# Patient Record
Sex: Male | Born: 1950 | Race: Black or African American | Hispanic: No | Marital: Married | Smoking: Current every day smoker
Health system: Southern US, Community
[De-identification: ages and names within clinical notes are randomized; demographics above are authoritative.]

## PROBLEM LIST (undated history)

## (undated) DIAGNOSIS — E785 Hyperlipidemia, unspecified: Secondary | ICD-10-CM

## (undated) HISTORY — PX: CHEST TUBE INSERTION: SHX231

## (undated) HISTORY — DX: Hyperlipidemia, unspecified: E78.5

---

## 1989-10-29 HISTORY — PX: HAND SURGERY: SHX662

## 2001-12-24 ENCOUNTER — Ambulatory Visit (HOSPITAL_COMMUNITY): Admission: RE | Admit: 2001-12-24 | Discharge: 2001-12-24 | Payer: Self-pay | Admitting: Family Medicine

## 2001-12-24 ENCOUNTER — Encounter: Payer: Self-pay | Admitting: Family Medicine

## 2020-10-29 ENCOUNTER — Emergency Department (HOSPITAL_COMMUNITY): Payer: 59

## 2020-10-29 ENCOUNTER — Other Ambulatory Visit: Payer: Self-pay

## 2020-10-29 ENCOUNTER — Encounter (HOSPITAL_COMMUNITY): Payer: Self-pay | Admitting: *Deleted

## 2020-10-29 ENCOUNTER — Inpatient Hospital Stay (HOSPITAL_COMMUNITY)
Admission: EM | Admit: 2020-10-29 | Discharge: 2020-11-06 | DRG: 205 | Disposition: A | Discharge status: Home Health | Payer: 59 | Attending: General Surgery | Admitting: General Surgery

## 2020-10-29 DIAGNOSIS — S2241XA Multiple fractures of ribs, right side, initial encounter for closed fracture: Secondary | ICD-10-CM | POA: Diagnosis present

## 2020-10-29 DIAGNOSIS — J9811 Atelectasis: Secondary | ICD-10-CM | POA: Diagnosis present

## 2020-10-29 DIAGNOSIS — J9601 Acute respiratory failure with hypoxia: Secondary | ICD-10-CM | POA: Diagnosis not present

## 2020-10-29 DIAGNOSIS — S2239XA Fracture of one rib, unspecified side, initial encounter for closed fracture: Secondary | ICD-10-CM | POA: Diagnosis not present

## 2020-10-29 DIAGNOSIS — I10 Essential (primary) hypertension: Secondary | ICD-10-CM | POA: Diagnosis present

## 2020-10-29 DIAGNOSIS — E876 Hypokalemia: Secondary | ICD-10-CM | POA: Diagnosis present

## 2020-10-29 DIAGNOSIS — F17210 Nicotine dependence, cigarettes, uncomplicated: Secondary | ICD-10-CM | POA: Diagnosis present

## 2020-10-29 DIAGNOSIS — Z9689 Presence of other specified functional implants: Secondary | ICD-10-CM

## 2020-10-29 DIAGNOSIS — E871 Hypo-osmolality and hyponatremia: Secondary | ICD-10-CM | POA: Diagnosis present

## 2020-10-29 DIAGNOSIS — S27321A Contusion of lung, unilateral, initial encounter: Secondary | ICD-10-CM | POA: Diagnosis not present

## 2020-10-29 DIAGNOSIS — S2249XA Multiple fractures of ribs, unspecified side, initial encounter for closed fracture: Secondary | ICD-10-CM

## 2020-10-29 DIAGNOSIS — R339 Retention of urine, unspecified: Secondary | ICD-10-CM | POA: Diagnosis not present

## 2020-10-29 DIAGNOSIS — S270XXA Traumatic pneumothorax, initial encounter: Secondary | ICD-10-CM | POA: Diagnosis present

## 2020-10-29 DIAGNOSIS — J439 Emphysema, unspecified: Secondary | ICD-10-CM

## 2020-10-29 DIAGNOSIS — Z20822 Contact with and (suspected) exposure to covid-19: Secondary | ICD-10-CM | POA: Diagnosis present

## 2020-10-29 DIAGNOSIS — R0600 Dyspnea, unspecified: Secondary | ICD-10-CM

## 2020-10-29 DIAGNOSIS — J432 Centrilobular emphysema: Secondary | ICD-10-CM | POA: Diagnosis present

## 2020-10-29 DIAGNOSIS — J939 Pneumothorax, unspecified: Secondary | ICD-10-CM

## 2020-10-29 LAB — CBC
HCT: 47.3 % (ref 39.0–52.0)
Hemoglobin: 15.3 g/dL (ref 13.0–17.0)
MCH: 28.8 pg (ref 26.0–34.0)
MCHC: 32.3 g/dL (ref 30.0–36.0)
MCV: 88.9 fL (ref 80.0–100.0)
Platelets: 259 10*3/uL (ref 150–400)
RBC: 5.32 MIL/uL (ref 4.22–5.81)
RDW: 13.8 % (ref 11.5–15.5)
WBC: 15.7 10*3/uL — ABNORMAL HIGH (ref 4.0–10.5)
nRBC: 0 % (ref 0.0–0.2)

## 2020-10-29 MED ORDER — IOHEXOL 300 MG/ML  SOLN
100.0000 mL | Freq: Once | INTRAMUSCULAR | Status: AC | PRN
  Administered 2020-10-30: 100 mL via INTRAVENOUS

## 2020-10-29 MED ORDER — FENTANYL CITRATE (PF) 100 MCG/2ML IJ SOLN
75.0000 ug | Freq: Once | INTRAMUSCULAR | Status: AC
  Administered 2020-10-29: 75 ug via INTRAVENOUS
  Filled 2020-10-29: qty 2

## 2020-10-29 MED ORDER — HYDROMORPHONE HCL 1 MG/ML IJ SOLN
0.5000 mg | Freq: Once | INTRAMUSCULAR | Status: AC
  Administered 2020-10-30: 0.5 mg via INTRAVENOUS
  Filled 2020-10-29: qty 1

## 2020-10-29 MED ORDER — ONDANSETRON 8 MG PO TBDP
8.0000 mg | ORAL_TABLET | Freq: Once | ORAL | Status: AC
  Administered 2020-10-29: 8 mg via ORAL
  Filled 2020-10-29: qty 1

## 2020-10-29 MED ORDER — HYDROCODONE-ACETAMINOPHEN 5-325 MG PO TABS
1.0000 | ORAL_TABLET | Freq: Once | ORAL | Status: AC
  Administered 2020-10-29: 1 via ORAL
  Filled 2020-10-29: qty 1
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
## 2020-10-29 NOTE — ED Triage Notes (Signed)
Denies hitting his head.  
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
## 2020-10-30 DIAGNOSIS — S27321A Contusion of lung, unilateral, initial encounter: Secondary | ICD-10-CM | POA: Diagnosis present

## 2020-10-30 DIAGNOSIS — F17210 Nicotine dependence, cigarettes, uncomplicated: Secondary | ICD-10-CM | POA: Diagnosis not present

## 2020-10-30 DIAGNOSIS — S2239XA Fracture of one rib, unspecified side, initial encounter for closed fracture: Secondary | ICD-10-CM | POA: Diagnosis present

## 2020-10-30 DIAGNOSIS — I10 Essential (primary) hypertension: Secondary | ICD-10-CM | POA: Diagnosis not present

## 2020-10-30 DIAGNOSIS — J9601 Acute respiratory failure with hypoxia: Secondary | ICD-10-CM | POA: Diagnosis not present

## 2020-10-30 DIAGNOSIS — E876 Hypokalemia: Secondary | ICD-10-CM | POA: Diagnosis not present

## 2020-10-30 DIAGNOSIS — J432 Centrilobular emphysema: Secondary | ICD-10-CM | POA: Diagnosis present

## 2020-10-30 DIAGNOSIS — Z20822 Contact with and (suspected) exposure to covid-19: Secondary | ICD-10-CM | POA: Diagnosis not present

## 2020-10-30 DIAGNOSIS — E871 Hypo-osmolality and hyponatremia: Secondary | ICD-10-CM | POA: Diagnosis not present

## 2020-10-30 DIAGNOSIS — J939 Pneumothorax, unspecified: Secondary | ICD-10-CM | POA: Diagnosis not present

## 2020-10-30 DIAGNOSIS — S270XXA Traumatic pneumothorax, initial encounter: Secondary | ICD-10-CM | POA: Diagnosis not present

## 2020-10-30 DIAGNOSIS — R339 Retention of urine, unspecified: Secondary | ICD-10-CM | POA: Diagnosis not present

## 2020-10-30 DIAGNOSIS — S2241XA Multiple fractures of ribs, right side, initial encounter for closed fracture: Secondary | ICD-10-CM | POA: Diagnosis not present

## 2020-10-30 DIAGNOSIS — J9811 Atelectasis: Secondary | ICD-10-CM | POA: Diagnosis not present

## 2020-10-30 LAB — CBC
HCT: 48.7 % (ref 39.0–52.0)
Hemoglobin: 15.8 g/dL (ref 13.0–17.0)
MCH: 28.7 pg (ref 26.0–34.0)
MCHC: 32.4 g/dL (ref 30.0–36.0)
MCV: 88.5 fL (ref 80.0–100.0)
Platelets: 251 10*3/uL (ref 150–400)
RBC: 5.5 MIL/uL (ref 4.22–5.81)
RDW: 13.7 % (ref 11.5–15.5)
WBC: 10 10*3/uL (ref 4.0–10.5)
nRBC: 0 % (ref 0.0–0.2)

## 2020-10-30 LAB — RESP PANEL BY RT-PCR (FLU A&B, COVID) ARPGX2
Influenza A by PCR: NEGATIVE
Influenza B by PCR: NEGATIVE
SARS Coronavirus 2 by RT PCR: NEGATIVE

## 2020-10-30 LAB — COMPREHENSIVE METABOLIC PANEL
ALT: 57 U/L — ABNORMAL HIGH (ref 0–44)
AST: 45 U/L — ABNORMAL HIGH (ref 15–41)
Albumin: 4 g/dL (ref 3.5–5.0)
Alkaline Phosphatase: 67 U/L (ref 38–126)
Anion gap: 8 (ref 5–15)
BUN: 12 mg/dL (ref 8–23)
CO2: 22 mmol/L (ref 22–32)
Calcium: 9 mg/dL (ref 8.9–10.3)
Chloride: 106 mmol/L (ref 98–111)
Creatinine, Ser: 0.83 mg/dL (ref 0.61–1.24)
GFR, Estimated: >60 mL/min (ref >60)
Glucose, Bld: 119 mg/dL — ABNORMAL HIGH (ref 70–99)
Potassium: 4.2 mmol/L (ref 3.5–5.1)
Sodium: 136 mmol/L (ref 135–145)
Total Bilirubin: 0.6 mg/dL (ref 0.3–1.2)
Total Protein: 7.3 g/dL (ref 6.5–8.1)

## 2020-10-30 LAB — CREATININE, SERUM
Creatinine, Ser: 0.91 mg/dL (ref 0.61–1.24)
GFR, Estimated: >60 mL/min (ref >60)

## 2020-10-30 MED ORDER — FENTANYL CITRATE (PF) 100 MCG/2ML IJ SOLN
100.0000 ug | Freq: Once | INTRAMUSCULAR | Status: AC
  Administered 2020-10-30: 100 ug via INTRAVENOUS
  Filled 2020-10-30: qty 2

## 2020-10-30 MED ORDER — FENTANYL CITRATE (PF) 100 MCG/2ML IJ SOLN
100.0000 ug | INTRAMUSCULAR | Status: DC | PRN
Start: 2020-10-30 — End: 2020-10-31
  Administered 2020-10-30 – 2020-10-31 (×10): 100 ug via INTRAVENOUS
  Filled 2020-10-30 (×10): qty 2

## 2020-10-30 MED ORDER — ENOXAPARIN SODIUM 40 MG/0.4ML ~~LOC~~ SOLN
40.0000 mg | SUBCUTANEOUS | Status: DC
  Administered 2020-10-30 – 2020-11-03 (×5): 40 mg via SUBCUTANEOUS
  Filled 2020-10-30 (×5): qty 0.4

## 2020-10-30 NOTE — ED Notes (Signed)
Pt to CT
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
## 2020-10-30 NOTE — ED Notes (Signed)
Pt placed on 2L of oxygen before being medicated, o2 was fluctuating between 87-90. Pt now at 94%
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
## 2020-10-30 NOTE — ED Notes (Signed)
ED Provider at bedside. 

## 2020-10-31 MED ORDER — KETOROLAC TROMETHAMINE 15 MG/ML IJ SOLN
15.0000 mg | Freq: Four times a day (QID) | INTRAMUSCULAR | Status: DC
  Administered 2020-10-31 – 2020-11-01 (×3): 15 mg via INTRAVENOUS
  Filled 2020-10-31 (×3): qty 1

## 2020-10-31 MED ORDER — MORPHINE SULFATE (PF) 2 MG/ML IV SOLN: 2.0000 mg | INTRAVENOUS | Status: DC | PRN

## 2020-10-31 MED ORDER — NICOTINE 14 MG/24HR TD PT24
14.0000 mg | MEDICATED_PATCH | Freq: Every day | TRANSDERMAL | Status: DC
  Administered 2020-10-31 – 2020-11-06 (×7): 14 mg via TRANSDERMAL
  Filled 2020-10-31 (×7): qty 1

## 2020-10-31 MED ORDER — ACETAMINOPHEN 160 MG/5ML PO SOLN
1000.0000 mg | Freq: Four times a day (QID) | ORAL | Status: DC
  Administered 2020-10-31 – 2020-11-06 (×22): 1000 mg via ORAL
  Filled 2020-10-31 (×23): qty 40.6

## 2020-10-31 MED ORDER — OXYCODONE HCL 5 MG/5ML PO SOLN
5.0000 mg | ORAL | Status: DC | PRN
  Administered 2020-10-31 – 2020-11-05 (×16): 10 mg via ORAL
  Administered 2020-11-05: 5 mg via ORAL
  Administered 2020-11-06 (×2): 10 mg via ORAL
  Filled 2020-10-31 (×19): qty 10

## 2020-10-31 MED ORDER — LIDOCAINE 5 % EX PTCH
1.0000 | MEDICATED_PATCH | CUTANEOUS | Status: DC
  Administered 2020-10-31 – 2020-11-05 (×6): 1 via TRANSDERMAL
  Filled 2020-10-31 (×6): qty 1

## 2020-10-31 MED ORDER — ACETAMINOPHEN 160 MG/5ML PO SOLN: 1000.0000 mg | Freq: Three times a day (TID) | ORAL | Status: DC

## 2020-10-31 MED ORDER — MORPHINE SULFATE (PF) 2 MG/ML IV SOLN
2.0000 mg | INTRAVENOUS | Status: DC | PRN
  Administered 2020-10-31 – 2020-11-03 (×4): 2 mg via INTRAVENOUS
  Filled 2020-10-31 (×6): qty 1

## 2020-10-31 MED ORDER — METHOCARBAMOL 1000 MG/10ML IJ SOLN
500.0000 mg | Freq: Four times a day (QID) | INTRAVENOUS | Status: DC
  Filled 2020-10-31 (×3): qty 5

## 2020-10-31 MED ORDER — METHOCARBAMOL 1000 MG/10ML IJ SOLN
1000.0000 mg | Freq: Four times a day (QID) | INTRAVENOUS | Status: DC
  Administered 2020-10-31 – 2020-11-04 (×13): 1000 mg via INTRAVENOUS
  Filled 2020-10-31: qty 10
  Filled 2020-10-31: qty 1000
  Filled 2020-10-31: qty 10
  Filled 2020-10-31: qty 1000
  Filled 2020-10-31: qty 10
  Filled 2020-10-31: qty 1000
  Filled 2020-10-31 (×5): qty 10
  Filled 2020-10-31: qty 1000
  Filled 2020-10-31 (×2): qty 10
  Filled 2020-10-31 (×2): qty 1000

## 2020-10-31 MED ORDER — KETOROLAC TROMETHAMINE 15 MG/ML IJ SOLN: 15.0000 mg | Freq: Four times a day (QID) | INTRAMUSCULAR | Status: DC | PRN

## 2020-10-31 NOTE — ED Notes (Signed)
Report given to [facility]. 

## 2020-10-31 NOTE — ED Notes (Signed)
Attempted to call report , waiting for call back 

## 2020-10-31 NOTE — ED Notes (Signed)
Report given to Lisa, RN.
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
## 2020-11-01 ENCOUNTER — Inpatient Hospital Stay (HOSPITAL_COMMUNITY): Payer: 59

## 2020-11-01 LAB — BASIC METABOLIC PANEL
Anion gap: 12 (ref 5–15)
BUN: 19 mg/dL (ref 8–23)
CO2: 20 mmol/L — ABNORMAL LOW (ref 22–32)
Calcium: 8.8 mg/dL — ABNORMAL LOW (ref 8.9–10.3)
Chloride: 104 mmol/L (ref 98–111)
Creatinine, Ser: 1.09 mg/dL (ref 0.61–1.24)
GFR, Estimated: >60 mL/min (ref >60)
Glucose, Bld: 96 mg/dL (ref 70–99)
Potassium: 4 mmol/L (ref 3.5–5.1)
Sodium: 136 mmol/L (ref 135–145)

## 2020-11-01 LAB — CBC
HCT: 47.3 % (ref 39.0–52.0)
Hemoglobin: 15.6 g/dL (ref 13.0–17.0)
MCH: 28.7 pg (ref 26.0–34.0)
MCHC: 33 g/dL (ref 30.0–36.0)
MCV: 87.1 fL (ref 80.0–100.0)
Platelets: 164 10*3/uL (ref 150–400)
RBC: 5.43 MIL/uL (ref 4.22–5.81)
RDW: 13.5 % (ref 11.5–15.5)
WBC: 22.2 10*3/uL — ABNORMAL HIGH (ref 4.0–10.5)
nRBC: 0 % (ref 0.0–0.2)

## 2020-11-01 MED ORDER — LACTATED RINGERS IV BOLUS
500.0000 mL | Freq: Once | INTRAVENOUS | Status: AC
  Administered 2020-11-01: 500 mL via INTRAVENOUS

## 2020-11-01 NOTE — TOC CAGE-AID Note (Signed)
Transition of Care [facility]) - CAGE-AID Screening   Patient Details  Name: Lucas George MRN: [id number] Date of Birth: 01/31/1951   Clinical Narrative: Patient denies any current alcohol or drug use.   CAGE-AID Screening:    Have You Ever Felt You Ought to Cut Down on Your Drinking or Drug Use?: No Have People Annoyed You By Critizing Your Drinking Or Drug Use?: No Have You Felt Bad Or Guilty About Your Drinking Or Drug Use?: No Have You Ever Had a Drink or Used Drugs First Thing In The Morning to Steady Your Nerves or to Get Rid of a Hangover?: No CAGE-AID Score: 0  Substance Abuse Education Offered: No
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
## 2020-11-01 NOTE — Plan of Care (Signed)

## 2020-11-01 NOTE — Progress Notes (Signed)
PT Cancellation Note  Patient Details Name: Lucas George MRN: [id number] DOB: [birth year]   Cancelled Treatment:    Reason Eval/Treat Not Completed: Medical issues which prohibited therapy.  O2 sats were down to 86% but then declined to 81% with painful deep breathing.  Will try again at another time.   Ivar Drape 11/01/2020, 4:33 PM   Samul Dada, PT MS Acute Rehab Dept. Number: [facility] [id number] and [facility] [phone]
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
## 2020-11-02 ENCOUNTER — Inpatient Hospital Stay (HOSPITAL_COMMUNITY): Payer: 59

## 2020-11-02 ENCOUNTER — Encounter (HOSPITAL_COMMUNITY): Payer: Self-pay | Admitting: General Surgery

## 2020-11-02 DIAGNOSIS — J432 Centrilobular emphysema: Secondary | ICD-10-CM

## 2020-11-02 DIAGNOSIS — S270XXA Traumatic pneumothorax, initial encounter: Secondary | ICD-10-CM | POA: Diagnosis not present

## 2020-11-02 DIAGNOSIS — J9601 Acute respiratory failure with hypoxia: Secondary | ICD-10-CM | POA: Diagnosis not present

## 2020-11-02 LAB — BASIC METABOLIC PANEL
Anion gap: 9 (ref 5–15)
BUN: 16 mg/dL (ref 8–23)
CO2: 22 mmol/L (ref 22–32)
Calcium: 8.6 mg/dL — ABNORMAL LOW (ref 8.9–10.3)
Chloride: 106 mmol/L (ref 98–111)
Creatinine, Ser: 0.9 mg/dL (ref 0.61–1.24)
GFR, Estimated: >60 mL/min (ref >60)
Glucose, Bld: 117 mg/dL — ABNORMAL HIGH (ref 70–99)
Potassium: 3.6 mmol/L (ref 3.5–5.1)
Sodium: 137 mmol/L (ref 135–145)

## 2020-11-02 LAB — CBC
HCT: 44.3 % (ref 39.0–52.0)
Hemoglobin: 14.4 g/dL (ref 13.0–17.0)
MCH: 28.5 pg (ref 26.0–34.0)
MCHC: 32.5 g/dL (ref 30.0–36.0)
MCV: 87.7 fL (ref 80.0–100.0)
Platelets: 219 10*3/uL (ref 150–400)
RBC: 5.05 MIL/uL (ref 4.22–5.81)
RDW: 13.5 % (ref 11.5–15.5)
WBC: 19.8 10*3/uL — ABNORMAL HIGH (ref 4.0–10.5)
nRBC: 0 % (ref 0.0–0.2)

## 2020-11-02 LAB — MAGNESIUM: Magnesium: 2 mg/dL (ref 1.7–2.4)

## 2020-11-02 MED ORDER — KETOROLAC TROMETHAMINE 15 MG/ML IJ SOLN
15.0000 mg | Freq: Three times a day (TID) | INTRAMUSCULAR | Status: DC
  Administered 2020-11-02 – 2020-11-06 (×12): 15 mg via INTRAVENOUS
  Filled 2020-11-02 (×13): qty 1

## 2020-11-02 MED ORDER — IOHEXOL 350 MG/ML SOLN
80.0000 mL | Freq: Once | INTRAVENOUS | Status: AC | PRN
  Administered 2020-11-02: 80 mL via INTRAVENOUS

## 2020-11-02 MED ORDER — ALBUTEROL SULFATE (2.5 MG/3ML) 0.083% IN NEBU: 2.5000 mg | INHALATION_SOLUTION | Freq: Four times a day (QID) | RESPIRATORY_TRACT | Status: DC | PRN

## 2020-11-02 MED ORDER — POTASSIUM CHLORIDE 20 MEQ PO PACK
40.0000 meq | PACK | Freq: Once | ORAL | Status: AC
  Administered 2020-11-02: 40 meq via ORAL
  Filled 2020-11-02: qty 2

## 2020-11-02 MED ORDER — IPRATROPIUM-ALBUTEROL 0.5-2.5 (3) MG/3ML IN SOLN: 3.0000 mL | Freq: Four times a day (QID) | RESPIRATORY_TRACT | Status: DC | PRN

## 2020-11-02 MED ORDER — IPRATROPIUM-ALBUTEROL 0.5-2.5 (3) MG/3ML IN SOLN
3.0000 mL | Freq: Four times a day (QID) | RESPIRATORY_TRACT | Status: DC
  Administered 2020-11-02: 3 mL via RESPIRATORY_TRACT
  Filled 2020-11-02: qty 3

## 2020-11-02 NOTE — Procedures (Signed)
On the date stated above, at the bedside in 6N14.  The patient's right chest was prepped and draped in the mid-clavicular line between the second and third ribs.  The area was anesthetized with local anesthetic down to the ribs.  A seeker needle was passed into the chest.  Positioning was confirmed with aspiration of air.  A wire was passed through the seeker needle into the chest.  A small skin incision was made at the skin.  The dilator was passed over the wire into the chest.  Then the [facility] Pneumothorax Catheter was advanced over the wire using the introducer into the chest.  The wire and introducer were removed and the catheter was connected to the Pleur-evac.  An air leak was noted and there was some bloody drainage through the chest tube.  The tube was secured to the skin using silk suture and a bandage was applied to support the tube from being dislodged.  The patient tolerated the procedure well and a post-procedure chest x-ray was obtained.

## 2020-11-02 NOTE — Progress Notes (Signed)
Assisted Dr Ivar Drape with right chest tube placement.  Patient is mildly anxious, RR 40s prior during and post procedure.  He tolerated well.  Post procedure VS documented. Awaiting post procedure chest xray.
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
## 2020-11-03 ENCOUNTER — Inpatient Hospital Stay (HOSPITAL_COMMUNITY): Payer: 59

## 2020-11-03 DIAGNOSIS — J9601 Acute respiratory failure with hypoxia: Secondary | ICD-10-CM | POA: Diagnosis not present

## 2020-11-03 DIAGNOSIS — J9811 Atelectasis: Secondary | ICD-10-CM | POA: Diagnosis not present

## 2020-11-03 DIAGNOSIS — J939 Pneumothorax, unspecified: Secondary | ICD-10-CM

## 2020-11-03 LAB — BASIC METABOLIC PANEL
Anion gap: 9 (ref 5–15)
BUN: 15 mg/dL (ref 8–23)
CO2: 20 mmol/L — ABNORMAL LOW (ref 22–32)
Calcium: 8.1 mg/dL — ABNORMAL LOW (ref 8.9–10.3)
Chloride: 101 mmol/L (ref 98–111)
Creatinine, Ser: 0.95 mg/dL (ref 0.61–1.24)
GFR, Estimated: >60 mL/min (ref >60)
Glucose, Bld: 112 mg/dL — ABNORMAL HIGH (ref 70–99)
Potassium: 3.4 mmol/L — ABNORMAL LOW (ref 3.5–5.1)
Sodium: 130 mmol/L — ABNORMAL LOW (ref 135–145)

## 2020-11-03 LAB — CBC
HCT: 41.3 % (ref 39.0–52.0)
Hemoglobin: 14.5 g/dL (ref 13.0–17.0)
MCH: 30.1 pg (ref 26.0–34.0)
MCHC: 35.1 g/dL (ref 30.0–36.0)
MCV: 85.9 fL (ref 80.0–100.0)
Platelets: 232 10*3/uL (ref 150–400)
RBC: 4.81 MIL/uL (ref 4.22–5.81)
RDW: 13.5 % (ref 11.5–15.5)
WBC: 17.2 10*3/uL — ABNORMAL HIGH (ref 4.0–10.5)
nRBC: 0 % (ref 0.0–0.2)

## 2020-11-03 MED ORDER — IPRATROPIUM-ALBUTEROL 0.5-2.5 (3) MG/3ML IN SOLN: 3.0000 mL | Freq: Four times a day (QID) | RESPIRATORY_TRACT | Status: DC

## 2020-11-03 MED ORDER — IPRATROPIUM-ALBUTEROL 0.5-2.5 (3) MG/3ML IN SOLN: 3.0000 mL | Freq: Four times a day (QID) | RESPIRATORY_TRACT | Status: DC | PRN

## 2020-11-03 MED ORDER — POTASSIUM CHLORIDE CRYS ER 20 MEQ PO TBCR
40.0000 meq | EXTENDED_RELEASE_TABLET | Freq: Two times a day (BID) | ORAL | Status: AC
  Administered 2020-11-03 (×2): 40 meq via ORAL
  Filled 2020-11-03 (×2): qty 2

## 2020-11-03 MED ORDER — REVEFENACIN 175 MCG/3ML IN SOLN
175.0000 ug | Freq: Every day | RESPIRATORY_TRACT | Status: DC
  Administered 2020-11-04: 175 ug via RESPIRATORY_TRACT
  Filled 2020-11-03 (×4): qty 3

## 2020-11-03 NOTE — Progress Notes (Signed)
72mL output from chest tube on night shift 11/02/20 7p-7a.
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
## 2020-11-04 ENCOUNTER — Inpatient Hospital Stay (HOSPITAL_COMMUNITY): Payer: 59

## 2020-11-04 LAB — BASIC METABOLIC PANEL
Anion gap: 8 (ref 5–15)
BUN: 16 mg/dL (ref 8–23)
CO2: 22 mmol/L (ref 22–32)
Calcium: 8.6 mg/dL — ABNORMAL LOW (ref 8.9–10.3)
Chloride: 110 mmol/L (ref 98–111)
Creatinine, Ser: 0.81 mg/dL (ref 0.61–1.24)
GFR, Estimated: >60 mL/min (ref >60)
Glucose, Bld: 91 mg/dL (ref 70–99)
Potassium: 4.1 mmol/L (ref 3.5–5.1)
Sodium: 140 mmol/L (ref 135–145)

## 2020-11-04 LAB — CBC
HCT: 42.6 % (ref 39.0–52.0)
Hemoglobin: 14.4 g/dL (ref 13.0–17.0)
MCH: 29.4 pg (ref 26.0–34.0)
MCHC: 33.8 g/dL (ref 30.0–36.0)
MCV: 86.9 fL (ref 80.0–100.0)
Platelets: 270 10*3/uL (ref 150–400)
RBC: 4.9 MIL/uL (ref 4.22–5.81)
RDW: 13.6 % (ref 11.5–15.5)
WBC: 11.5 10*3/uL — ABNORMAL HIGH (ref 4.0–10.5)
nRBC: 0 % (ref 0.0–0.2)

## 2020-11-04 MED ORDER — METHOCARBAMOL 500 MG PO TABS
1000.0000 mg | ORAL_TABLET | Freq: Three times a day (TID) | ORAL | Status: DC
  Administered 2020-11-04 – 2020-11-06 (×7): 1000 mg via ORAL
  Filled 2020-11-04 (×7): qty 2

## 2020-11-04 MED ORDER — ENOXAPARIN SODIUM 30 MG/0.3ML ~~LOC~~ SOLN
30.0000 mg | Freq: Two times a day (BID) | SUBCUTANEOUS | Status: DC
  Administered 2020-11-04 – 2020-11-06 (×5): 30 mg via SUBCUTANEOUS
  Filled 2020-11-04 (×5): qty 0.3

## 2020-11-04 MED ORDER — HYDRALAZINE HCL 20 MG/ML IJ SOLN: 10.0000 mg | Freq: Three times a day (TID) | INTRAMUSCULAR | Status: DC | PRN
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
## 2020-11-04 NOTE — TOC Progression Note (Signed)
Transition of Care [facility]) - Progression Note    Patient Details  Name: REVIS WHALIN MRN: [id number] Date of Birth: Nov 13, 1950  Transition of Care [facility]) CM/SW Contact  Astrid Drafts Berna Spare, RN Phone Number: 11/04/2020, 5:15 PM  Clinical Narrative:  Referral to [facility] for recommended DME and home oxygen.  Plan dc over the weekend; pt to follow up with pulmonary MD for O2 follow up as he has no PCP.      Expected Discharge Plan: Home w Home Health Services Barriers to Discharge: Continued Medical Work up  Expected Discharge Plan and Services Expected Discharge Plan: Home w Home Health Services   Discharge Planning Services: CM Consult   Living arrangements for the past 2 months: Single Family Home                 DME Arranged: 3-N-1,Tub bench,Walker rolling,Oxygen   Date DME Agency Contacted: 11/04/20 Time DME Agency Contacted: 1256 Representative spoke with at DME Agency: Lenard Galloway             Social Determinants of Health (SDOH) Interventions    Readmission Risk Interventions No flowsheet data found.   Quintella Baton, RN, BSN  Trauma/Neuro ICU Case Manager [phone]
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
## 2020-11-04 NOTE — Progress Notes (Signed)
SATURATION QUALIFICATIONS: (This note is used to comply with regulatory documentation for home oxygen)  Patient Saturations on Room Air at Rest =87%  Patient Saturations on Room Air while Ambulating =87%  Patient Saturations on 4 Liters of oxygen while Ambulating =90%  Please briefly explain why patient needs home oxygen:
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
## 2020-11-05 ENCOUNTER — Inpatient Hospital Stay (HOSPITAL_COMMUNITY): Payer: 59

## 2020-11-05 LAB — CBC
HCT: 43.4 % (ref 39.0–52.0)
Hemoglobin: 14 g/dL (ref 13.0–17.0)
MCH: 28.5 pg (ref 26.0–34.0)
MCHC: 32.3 g/dL (ref 30.0–36.0)
MCV: 88.4 fL (ref 80.0–100.0)
Platelets: 282 10*3/uL (ref 150–400)
RBC: 4.91 MIL/uL (ref 4.22–5.81)
RDW: 13.5 % (ref 11.5–15.5)
WBC: 8.9 10*3/uL (ref 4.0–10.5)
nRBC: 0 % (ref 0.0–0.2)

## 2020-11-05 LAB — BASIC METABOLIC PANEL
Anion gap: 9 (ref 5–15)
BUN: 12 mg/dL (ref 8–23)
CO2: 22 mmol/L (ref 22–32)
Calcium: 8.5 mg/dL — ABNORMAL LOW (ref 8.9–10.3)
Chloride: 108 mmol/L (ref 98–111)
Creatinine, Ser: 0.67 mg/dL (ref 0.61–1.24)
GFR, Estimated: >60 mL/min (ref >60)
Glucose, Bld: 96 mg/dL (ref 70–99)
Potassium: 4 mmol/L (ref 3.5–5.1)
Sodium: 139 mmol/L (ref 135–145)
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
## 2020-11-06 LAB — CREATININE, SERUM
Creatinine, Ser: 0.91 mg/dL (ref 0.61–1.24)
GFR, Estimated: >60 mL/min (ref >60)

## 2020-11-06 MED ORDER — OXYCODONE HCL 5 MG/5ML PO SOLN: 5.0000 mg | ORAL | 0 refills | Status: DC | PRN

## 2020-11-06 MED ORDER — ACETAMINOPHEN 160 MG/5ML PO SOLN: 1000.0000 mg | Freq: Four times a day (QID) | ORAL | 0 refills | Status: DC

## 2020-11-06 MED ORDER — TRELEGY ELLIPTA 200-62.5-25 MCG/INH IN AEPB: 1.0000 | INHALATION_SPRAY | Freq: Every day | RESPIRATORY_TRACT | 0 refills | Status: DC
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
## 2020-11-06 NOTE — Progress Notes (Signed)
Dossie Arbour Bethelto be D/C'd per MD order. Discussed with the patient and all questions fully answered. ? VSS, Skin clean, dry and intact without evidence of skin break down, no evidence of skin tears noted. ? IV catheter discontinued intact. Site without signs and symptoms of complications. Dressing and pressure applied. ? An After Visit Summary was printed and given to the patient. Patient informed where to pickup prescriptions. ? D/c education completed with patient/family including follow up instructions, medication list, d/c activities limitations if indicated, with other d/c instructions as indicated by MD - patient able to verbalize understanding, all questions fully answered.  ? Patient instructed to return to ED, call 911, or call MD for any changes in condition.  ? Patient to be escorted via WC, and D/C home via private auto.

## 2020-11-06 NOTE — Discharge Instructions (Signed)
PNEUMOTHORAX OR HEMOTHORAX +/- RIB FRACTURES  HOME INSTRUCTIONS   1. PAIN CONTROL:  1. Pain is best controlled by a usual combination of three different methods TOGETHER:  i. Ice/Heat ii. Over the counter pain medication iii. Prescription pain medication 2. You may experience some swelling and bruising in area of broken ribs. Ice packs or heating pads (30-60 minutes up to 6 times a day) will help. Use ice for the first few days to help decrease swelling and bruising, then switch to heat to help relax tight/sore spots and speed recovery. Some people prefer to use ice alone, heat alone, alternating between ice & heat. Experiment to what works for you. Swelling and bruising can take several weeks to resolve.  3. It is helpful to take an over-the-counter pain medication regularly for the first few weeks. Choose one of the following that works best for you:  i. Naproxen (Aleve, etc) Two 220mg tabs twice a day ii. Ibuprofen (Advil, etc) Three 200mg tabs four times a day (every meal & bedtime) iii. Acetaminophen (Tylenol, etc) 500-650mg four times a day (every meal & bedtime) 4. A prescription for pain medication (such as oxycodone, hydrocodone, etc) may be given to you upon discharge. Take your pain medication as prescribed.  i. If you are having problems/concerns with the prescription medicine (does not control pain, nausea, vomiting, rash, itching, etc), please call us [phone] to see if we need to switch you to a different pain medicine that will work better for you and/or control your side effect better. ii. If you need a refill on your pain medication, please contact your pharmacy. They will contact our office to request authorization. Prescriptions will not be filled after 5 pm or on week-ends. 1. Avoid getting constipated. When taking pain medications, it is common to experience some constipation. Increasing fluid intake and taking a fiber supplement (such as Metamucil, Citrucel, FiberCon,  MiraLax, etc) 1-2 times a day regularly will usually help prevent this problem from occurring. A mild laxative (prune juice, Milk of Magnesia, MiraLax, etc) should be taken according to package directions if there are no bowel movements after 48 hours.  2. Watch out for diarrhea. If you have many loose bowel movements, simplify your diet to bland foods & liquids for a few days. Stop any stool softeners and decrease your fiber supplement. Switching to mild anti-diarrheal medications (Kayopectate, Pepto Bismol) can help. If this worsens or does not improve, please call us. 3. Chest tube site wound: you may remove the dressing from your chest tube site 3 days after the removal of your chest tube. DO NOT shower over the dressing. Once   removed, you may shower as normal. Do not submerge your wound in water for 2-3 weeks.  4. FOLLOW UP  a. Please call our office to set up or confirm an appointment for follow up for 2 weeks after discharge. You will need to get a chest xray at either [facility] or [facility]. This will be outlined in your follow up instructions. Please call [facility] at [phone] if you have any questions about follow up.  b. If you have any orthopedic or other injuries you will need to follow up as outlined in your follow up instructions.   WHEN TO CALL US [phone]:  1. Poor pain control 2. Reactions / problems with new medications (rash/itching, nausea, etc)  3. Fever over 101.5 F (38.5 C) 4. Worsening swelling or bruising 5. Redness, drainage, pain or swelling around chest   tube site 6. Worsening pain, productive cough, difficulty breathing or any other concerning symptoms  The clinic staff is available to answer your questions during regular business hours (8:30am-5pm). Please don't hesitate to call and ask to speak to one of our nurses for clinical concerns.  If you have a medical emergency, go to the nearest emergency room or call 911.  A surgeon from [facility] is always on call at the hospitals   [facility]  [address], [address], [place], [place] [address] ?  MAIN: [phone] ? TOLL FREE: [phone] ?  FAX [phone]  [email or web address]      Information on Rib Fractures  A rib fracture is a break or crack in one of the bones of the ribs. The ribs are long, curved bones that wrap around your chest and attach to your spine and your breastbone. The ribs protect your heart, lungs, and other organs in the chest. A broken or cracked rib is often painful but is not usually serious. Most rib fractures heal on their own over time. However, rib fractures can be more serious if multiple ribs are broken or if broken ribs move out of place and push against other structures or organs. What are the causes? This condition is caused by:  Repetitive movements with high force, such as pitching a baseball or having severe coughing spells.  A direct blow to the chest, such as a sports injury, a car accident, or a fall.  Cancer that has spread to the bones, which can weaken bones and cause them to break. What are the signs or symptoms? Symptoms of this condition include:  Pain when you breathe in or cough.  Pain when someone presses on the injured area.  Feeling short of breath. How is this diagnosed? This condition is diagnosed with a physical exam and medical history. Imaging tests may also be done, such as:  Chest X-ray.  CT scan.  MRI.  Bone scan.  Chest ultrasound. How is this treated? Treatment for this condition depends on the severity of the fracture. Most rib fractures usually heal on their own in 1-3 months. Sometimes healing takes longer if there is a cough that does not stop or if there are other activities that make the injury worse (aggravating factors). While you heal, you will be given medicines to control the pain. You will also be taught deep breathing  exercises. Severe injuries may require hospitalization or surgery. Follow these instructions at home: Managing pain, stiffness, and swelling  If directed, apply ice to the injured area. ? Put ice in a plastic bag. ? Place a towel between your skin and the bag. ? Leave the ice on for 20 minutes, 2-3 times a day.  Take over-the-counter and prescription medicines only as told by your health care provider. Activity  Avoid a lot of activity and any activities or movements that cause pain. Be careful during activities and avoid bumping the injured rib.  Slowly increase your activity as told by your health care provider. General instructions  Do deep breathing exercises as told by your health care provider. This helps prevent pneumonia, which is a common complication of a broken rib. Your health care provider may instruct you to: ? Take deep breaths several times a day. ? Try to cough several times a day, holding a pillow against the injured area. ? Use a device called incentive spirometer to practice deep breathing several times a day.  Drink enough   fluid to keep your urine pale yellow.  Do not wear a rib belt or binder. These restrict breathing, which can lead to pneumonia.  Keep all follow-up visits as told by your health care provider. This is important. Contact a health care provider if:  You have a fever. Get help right away if:  You have difficulty breathing or you are short of breath.  You develop a cough that does not stop, or you cough up thick or bloody sputum.  You have nausea, vomiting, or pain in your abdomen.  Your pain gets worse and medicine does not help. Summary  A rib fracture is a break or crack in one of the bones of the ribs.  A broken or cracked rib is often painful but is not usually serious.  Most rib fractures heal on their own over time.  Treatment for this condition depends on the severity of the fracture.  Avoid a lot of activity and any  activities or movements that cause pain. This information is not intended to replace advice given to you by your health care provider. Make sure you discuss any questions you have with your health care provider. Document Released: 10/15/2005 Document Revised: 01/14/2017 Document Reviewed: 01/14/2017 Elsevier Interactive Patient Education  2019 Elsevier Inc.    Pneumothorax A pneumothorax is commonly called a collapsed lung. It is a condition in which air leaks from a lung and builds up between the thin layer of tissue that covers the lungs (visceral pleura) and the interior wall of the chest cavity (parietal pleura). The air gets trapped outside the lung, between the lung and the chest wall (pleural space). The air takes up space and prevents the lung from fully expanding. This condition sometimes occurs suddenly with no apparent cause. The buildup of air may be small or large. A small pneumothorax may go away on its own. A large pneumothorax will require treatment and hospitalization. What are the causes? This condition may be caused by:  Trauma and injury to the chest wall.  Surgery and other medical procedures.  A complication of an underlying lung problem, especially chronic obstructive pulmonary disease (COPD) or emphysema. Sometimes the cause of this condition is not known. What increases the risk? You are more likely to develop this condition if:  You have an underlying lung problem.  You smoke.  You are 20-40 years old, male, tall, and underweight.  You have a personal or family history of pneumothorax.  You have an eating disorder (anorexia nervosa). This condition can also happen quickly, even in people with no history of lung problems. What are the signs or symptoms? Sometimes a pneumothorax will have no symptoms. When symptoms are present, they can include:  Chest pain.  Shortness of breath.  Increased rate of breathing.  Bluish color to your lips or skin  (cyanosis). How is this diagnosed? This condition may be diagnosed by:  A medical history and physical exam.  A chest X-ray, chest CT scan, or ultrasound. How is this treated? Treatment depends on how severe your condition is. The goal of treatment is to remove the extra air and allow your lung to expand back to its normal size.  For a small pneumothorax: ? No treatment may be needed. ? Extra oxygen is sometimes used to make it go away more quickly.  For a large pneumothorax or a pneumothorax that is causing symptoms, a procedure is done to drain the air from your lungs. To do this, a health care provider may   use: ? A needle with a syringe. This is used to suck air from a pleural space where no additional leakage is taking place. ? A chest tube. This is used to suck air where there is ongoing leakage into the pleural space. The chest tube may need to remain in place for several days until the air leak has healed.  In more severe cases, surgery may be needed to repair the damage that is causing the leak.  If you have multiple pneumothorax episodes or have an air leak that will not heal, a procedure called a pleurodesis may be done. A medicine is placed in the pleural space to irritate the tissues around the lung so that the lung will stick to the chest wall, seal any leaks, and stop any buildup of air in that space. If you have an underlying lung problem, severe symptoms, or a large pneumothorax you will usually need to stay in the hospital. Follow these instructions at home: Lifestyle  Do not use any products that contain nicotine or tobacco, such as cigarettes and e-cigarettes. These are major risk factors in pneumothorax. If you need help quitting, ask your health care provider.  Do not lift anything that is heavier than 10 lb (4.5 kg), or the limit that your health care provider tells you, until he or she says that it is safe.  Avoid activities that take a lot of effort (strenuous)  for as long as told by your health care provider.  Return to your normal activities as told by your health care provider. Ask your health care provider what activities are safe for you.  Do not fly in an airplane or scuba dive until your health care provider says it is okay. General instructions  Take over-the-counter and prescription medicines only as told by your health care provider.  If a cough or pain makes it difficult for you to sleep at night, try sleeping in a semi-upright position in a recliner or by using 2 or 3 pillows.  If you had a chest tube and it was removed, ask your health care provider when you can remove the bandage (dressing). While the dressing is in place, do not allow it to get wet.  Keep all follow-up visits as told by your health care provider. This is important. Contact a health care provider if:  You cough up thick mucus (sputum) that is yellow or green in color.  You were treated with a chest tube, and you have redness, increasing pain, or discharge at the site where it was placed. Get help right away if:  You have increasing chest pain or shortness of breath.  You have a cough that will not go away.  You begin coughing up blood.  You have pain that is getting worse or is not controlled with medicines.  The site where your chest tube was located opens up.  You feel air coming out of the site where the chest tube was placed.  You have a fever or persistent symptoms for more than 2-3 days.  You have a fever and your symptoms suddenly get worse. These symptoms may represent a serious problem that is an emergency. Do not wait to see if the symptoms will go away. Get medical help right away. Call your local emergency services (911 in the [place] Do not drive yourself to the hospital. Summary  A pneumothorax, commonly called a collapsed lung, is a condition in which air leaks from a lung and gets trapped between the   lung and the chest wall (pleural  space).  The buildup of air may be small or large. A small pneumothorax may go away on its own. A large pneumothorax will require treatment and hospitalization.  Treatment for this condition depends on how severe the pneumothorax is. The goal of treatment is to remove the extra air and allow the lung to expand back to its normal size. This information is not intended to replace advice given to you by your health care provider. Make sure you discuss any questions you have with your health care provider. Document Released: 10/15/2005 Document Revised: 09/23/2017 Document Reviewed: 09/23/2017 Elsevier Interactive Patient Education  2019 Elsevier Inc.   

## 2020-11-23 ENCOUNTER — Other Ambulatory Visit: Payer: Self-pay

## 2020-11-23 ENCOUNTER — Ambulatory Visit (INDEPENDENT_AMBULATORY_CARE_PROVIDER_SITE_OTHER): Payer: 59 | Admitting: Internal Medicine

## 2020-11-23 ENCOUNTER — Ambulatory Visit (HOSPITAL_COMMUNITY)
Admission: RE | Admit: 2020-11-23 | Discharge: 2020-11-23 | Disposition: A | Discharge status: Home / Self Care | Payer: 59 | Source: Ambulatory Visit | Attending: Internal Medicine | Admitting: Internal Medicine

## 2020-11-23 ENCOUNTER — Encounter: Payer: Self-pay | Admitting: Internal Medicine

## 2020-11-23 DIAGNOSIS — R0600 Dyspnea, unspecified: Secondary | ICD-10-CM | POA: Insufficient documentation

## 2020-11-23 DIAGNOSIS — S2241XA Multiple fractures of ribs, right side, initial encounter for closed fracture: Secondary | ICD-10-CM | POA: Diagnosis not present

## 2020-11-23 DIAGNOSIS — J449 Chronic obstructive pulmonary disease, unspecified: Secondary | ICD-10-CM | POA: Diagnosis not present

## 2020-11-23 DIAGNOSIS — S270XXA Traumatic pneumothorax, initial encounter: Secondary | ICD-10-CM

## 2020-11-23 DIAGNOSIS — R0609 Other forms of dyspnea: Secondary | ICD-10-CM

## 2020-11-23 NOTE — Patient Instructions (Signed)
Take it easy  - your chest pain will gradually resolve over the next 4 weeks   Goal is to keep you oxygen level greater than 90% and continue your therapy   Please remember to go to the  x-ray department  @  [facility] for your tests - we will call you with the results when they are available      I very strongly recommend you get the moderna or pfizer vaccine as soon as possible based on your risk of dying from the virus  and the proven safety and benefit of these vaccines against even the delta and omicron variants.  This can save your life as well as  those of your loved ones,  especially if they are also not vaccinated.     Please schedule a follow up office visit in 4 weeks, sooner if needed

## 2020-11-23 NOTE — Assessment & Plan Note (Addendum)
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 Change to anoro at next ov   >>> maintaining off cigs key   >>> advised: Patient is unvaccinated and was informed of the seriousness of COVID 19 infection as a direct risk to lung health as well as safety to close contacts and should continue to wear a facemask in public and minimize exposure to public locations but especially avoid any area or activity where non-close contacts are not observing distancing or wearing an appropriate face mask.  I strongly recommended pt take either of the vaccines available through local drugstores based on updated information on millions of Americans treated with the Moderna and [employer] products  which have proven both safe and  effective even against the  delta and new omicron variant to prevent hospitalization and death.        Each maintenance medication was reviewed in detail including emphasizing most importantly the difference between maintenance and prns and under what circumstances the prns are to be triggered using an action plan format where appropriate.  Total time for H and P, chart review, counseling, reviewing elipta device(s)  directly observing portions of ambulatory 02 saturation study/ and generating customized AVS unique to this post hosp f/u  office visit / same day charting  > 40 min
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
## 2020-11-24 ENCOUNTER — Encounter: Payer: Self-pay | Admitting: *Deleted

## 2020-11-24 NOTE — Progress Notes (Signed)
Letter mailed

## 2020-11-30 ENCOUNTER — Ambulatory Visit
Admission: RE | Admit: 2020-11-30 | Discharge: 2020-11-30 | Disposition: A | Discharge status: Home / Self Care | Payer: 59 | Source: Ambulatory Visit | Attending: General Surgery | Admitting: General Surgery

## 2020-11-30 ENCOUNTER — Other Ambulatory Visit: Payer: Self-pay | Admitting: General Surgery

## 2020-11-30 DIAGNOSIS — J939 Pneumothorax, unspecified: Secondary | ICD-10-CM

## 2020-12-20 ENCOUNTER — Ambulatory Visit (INDEPENDENT_AMBULATORY_CARE_PROVIDER_SITE_OTHER): Payer: 59 | Admitting: Internal Medicine

## 2020-12-20 ENCOUNTER — Other Ambulatory Visit: Payer: Self-pay

## 2020-12-20 ENCOUNTER — Encounter: Payer: Self-pay | Admitting: Internal Medicine

## 2020-12-20 DIAGNOSIS — F17210 Nicotine dependence, cigarettes, uncomplicated: Secondary | ICD-10-CM

## 2020-12-20 DIAGNOSIS — J449 Chronic obstructive pulmonary disease, unspecified: Secondary | ICD-10-CM | POA: Diagnosis not present
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
## 2020-12-20 NOTE — Patient Instructions (Addendum)
The key is to stop smoking completely before smoking completely stops you!   I very strongly recommend you get the moderna or pfizer vaccine as soon as possible based on your risk of dying from the virus  and the proven safety and benefit of these vaccines against even the delta and omicron variants.  This can save your life as well as  those of your loved ones,  especially if they are also not vaccinated.     Pulmonary follow up is as needed

## 2020-12-21 ENCOUNTER — Encounter: Payer: Self-pay | Admitting: Internal Medicine

## 2020-12-21 DIAGNOSIS — F17210 Nicotine dependence, cigarettes, uncomplicated: Secondary | ICD-10-CM | POA: Insufficient documentation

## 2020-12-21 NOTE — Assessment & Plan Note (Addendum)
Active smoker - CT chest 10/29/20 with emphysema     I reviewed the Fletcher curve with the patient that basically indicates  if you quit smoking when your best day FEV1 is still   preserved (as is still likely  the case here)  it is highly unlikely you will progress to severe disease and informed the patient there was  no medication on the market that has proven to alter the curve/ its downward trajectory  or the likelihood of progression of their disease(unlike other chronic medical conditions such as atheroclerosis where we do think we can change the natural hx with risk reducing meds)    Therefore stopping smoking and maintaining abstinence are  the most important aspects of care, not choice of inhalers or for that matter, doctors.   Treatment other than smoking cessation  is entirely directed by severity of symptoms and focused also on reducing exacerbations, not attempting to change the natural history of the disease.    Since not having aecopd or limiting symptoms no need for medication or f/u in pulmonary clinic   Advised candidate for low dose screen CT yearly if interested but stopping smoking is more important statistically than CT scans in terms of morbidity / mortality.          Each maintenance medication was reviewed in detail including emphasizing most importantly the difference between maintenance and prns and under what circumstances the prns are to be triggered using an action plan format where appropriate.  Total time for H and P, chart review, counseling,   and generating customized AVS unique to this office visit / same day charting = 25 min

## 2020-12-21 NOTE — Assessment & Plan Note (Addendum)
4-5 min discussion re active cigarette smoking in addition to office E&M  Ask about tobacco use:   ongoing Advise quitting   I took an extended  opportunity with this patient to outline the consequences of continued cigarette use  in airway disorders based on all the data we have from the multiple national lung health studies (perfomed over decades at millions of dollars in cost)  indicating that smoking cessation, not choice of inhalers or physicians, is the most important aspect of his care.   Assess willingness:  Not committed at this point Assist in quit attempt:  Per PCP when ready Arrange follow up:   Follow up per Primary Care planned        

## 2021-05-15 ENCOUNTER — Other Ambulatory Visit: Payer: Self-pay

## 2021-05-15 ENCOUNTER — Ambulatory Visit: Payer: 59 | Admitting: Podiatry
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
## 2022-04-12 ENCOUNTER — Observation Stay (HOSPITAL_COMMUNITY)
Admission: EM | Admit: 2022-04-12 | Discharge: 2022-04-13 | Discharge status: Against Medical Advice | Payer: 59 | Attending: Internal Medicine | Admitting: Internal Medicine

## 2022-04-12 ENCOUNTER — Encounter (HOSPITAL_COMMUNITY): Payer: Self-pay

## 2022-04-12 DIAGNOSIS — I959 Hypotension, unspecified: Secondary | ICD-10-CM | POA: Insufficient documentation

## 2022-04-12 DIAGNOSIS — F17210 Nicotine dependence, cigarettes, uncomplicated: Secondary | ICD-10-CM | POA: Diagnosis not present

## 2022-04-12 DIAGNOSIS — J449 Chronic obstructive pulmonary disease, unspecified: Secondary | ICD-10-CM | POA: Insufficient documentation

## 2022-04-12 DIAGNOSIS — K922 Gastrointestinal hemorrhage, unspecified: Secondary | ICD-10-CM | POA: Diagnosis present

## 2022-04-12 DIAGNOSIS — K921 Melena: Secondary | ICD-10-CM

## 2022-04-12 DIAGNOSIS — E872 Acidosis, unspecified: Secondary | ICD-10-CM | POA: Insufficient documentation

## 2022-04-12 DIAGNOSIS — R040 Epistaxis: Secondary | ICD-10-CM | POA: Insufficient documentation

## 2022-04-12 LAB — COMPREHENSIVE METABOLIC PANEL
ALT: 32 U/L (ref 0–44)
AST: 19 U/L (ref 15–41)
Albumin: 3.4 g/dL — ABNORMAL LOW (ref 3.5–5.0)
Alkaline Phosphatase: 63 U/L (ref 38–126)
Anion gap: 7 (ref 5–15)
BUN: 33 mg/dL — ABNORMAL HIGH (ref 8–23)
CO2: 21 mmol/L — ABNORMAL LOW (ref 22–32)
Calcium: 8.8 mg/dL — ABNORMAL LOW (ref 8.9–10.3)
Chloride: 112 mmol/L — ABNORMAL HIGH (ref 98–111)
Creatinine, Ser: 1.13 mg/dL (ref 0.61–1.24)
GFR, Estimated: >60 mL/min (ref >60)
Glucose, Bld: 129 mg/dL — ABNORMAL HIGH (ref 70–99)
Potassium: 4.1 mmol/L (ref 3.5–5.1)
Sodium: 140 mmol/L (ref 135–145)
Total Bilirubin: 0.6 mg/dL (ref 0.3–1.2)
Total Protein: 6.4 g/dL — ABNORMAL LOW (ref 6.5–8.1)

## 2022-04-12 LAB — CBC
HCT: 44.3 % (ref 39.0–52.0)
Hemoglobin: 14.3 g/dL (ref 13.0–17.0)
MCH: 28.9 pg (ref 26.0–34.0)
MCHC: 32.3 g/dL (ref 30.0–36.0)
MCV: 89.5 fL (ref 80.0–100.0)
Platelets: 275 10*3/uL (ref 150–400)
RBC: 4.95 MIL/uL (ref 4.22–5.81)
RDW: 13.8 % (ref 11.5–15.5)
WBC: 7.6 10*3/uL (ref 4.0–10.5)
nRBC: 0 % (ref 0.0–0.2)

## 2022-04-12 LAB — TYPE AND SCREEN
ABO/RH(D): B POS
Antibody Screen: NEGATIVE

## 2022-04-12 LAB — POC OCCULT BLOOD, ED: Fecal Occult Bld: POSITIVE — AB

## 2022-04-12 LAB — PROTIME-INR
INR: 1.2 (ref 0.8–1.2)
Prothrombin Time: 15.2 seconds (ref 11.4–15.2)

## 2022-04-12 LAB — ABO/RH: ABO/RH(D): B POS

## 2022-04-12 MED ORDER — PANTOPRAZOLE SODIUM 40 MG IV SOLR
40.0000 mg | Freq: Once | INTRAVENOUS | Status: AC
  Administered 2022-04-12: 40 mg via INTRAVENOUS
  Filled 2022-04-12: qty 10

## 2022-04-12 MED ORDER — PANTOPRAZOLE SODIUM 40 MG IV SOLR: 40.0000 mg | Freq: Two times a day (BID) | INTRAVENOUS | Status: DC

## 2022-04-12 MED ORDER — LACTATED RINGERS IV SOLN
INTRAVENOUS | Status: DC
Start: 2022-04-12 — End: 2022-04-13

## 2022-04-12 MED ORDER — MELATONIN 5 MG PO TABS: 5.0000 mg | ORAL_TABLET | Freq: Every evening | ORAL | Status: DC | PRN

## 2022-04-12 MED ORDER — OXYMETAZOLINE HCL 0.05 % NA SOLN
1.0000 | Freq: Once | NASAL | Status: DC
  Filled 2022-04-12: qty 30

## 2022-04-12 MED ORDER — OXYCODONE HCL 5 MG PO TABS: 5.0000 mg | ORAL_TABLET | Freq: Four times a day (QID) | ORAL | Status: DC | PRN

## 2022-04-12 MED ORDER — PANTOPRAZOLE SODIUM 40 MG IV SOLR
40.0000 mg | Freq: Two times a day (BID) | INTRAVENOUS | Status: DC
  Administered 2022-04-13: 40 mg via INTRAVENOUS
  Filled 2022-04-12: qty 10

## 2022-04-12 MED ORDER — LACTATED RINGERS IV BOLUS
1000.0000 mL | Freq: Once | INTRAVENOUS | Status: AC
  Administered 2022-04-12: 1000 mL via INTRAVENOUS

## 2022-04-12 MED ORDER — ACETAMINOPHEN 325 MG PO TABS: 650.0000 mg | ORAL_TABLET | Freq: Four times a day (QID) | ORAL | Status: DC | PRN

## 2022-04-12 MED ORDER — ONDANSETRON HCL 4 MG/2ML IJ SOLN: 4.0000 mg | Freq: Four times a day (QID) | INTRAMUSCULAR | Status: DC | PRN

## 2022-04-12 NOTE — ED Provider Triage Note (Signed)
Emergency Medicine Provider Triage Evaluation Note  Lucas George , a 71 y.o. male  was evaluated in triage.  Pt complains of nose bleed, vomiting blood and black stool   Review of Systems  Positive:  Negative:   Physical Exam  BP (!) 86/68 (BP Location: Left Arm)   Pulse 71   Resp (!) 22   Ht 5\' 7"  (1.702 m)   Wt 63.5 kg   SpO2 93%   BMI 21.93 kg/m  Gen:   Awake, dried blood nose Resp:  Normal effort  MSK:   Moves extremities without difficulty  Other:    Medical Decision Making  Medically screening exam initiated at 8:35 PM.  Appropriate orders placed.  Lucas George was informed that the remainder of the evaluation will be completed by another provider, this initial triage assessment does not replace that evaluation, and the importance of remaining in the ED until their evaluation is complete.     Lucas George, Lucas George 04/12/22 2036

## 2022-04-12 NOTE — ED Triage Notes (Signed)
Pt reports that he has been having dark stool that started today with dizziness, nose bleeding and vomiting blood as well.

## 2022-04-12 NOTE — ED Provider Notes (Signed)
[facility] EMERGENCY DEPARTMENT Provider Note   CSN: [id number] Arrival date & time: 04/12/22  2004     History  Chief Complaint  Patient presents with   GI Bleeding    71 year old male with past medical history of COPD not on baseline O2 presents to the following an acute onset of melena in the setting of recurrent episodes of epistaxis.  Daughter at bedside states the patient has had x3 episodes of bright red blood from both naris, each episode lasting between 15-30 minutes.  He is not currently anticoagulated.  Daughter states that the patient had a URI last week but has since been doing well.  Following these 3 episodes of epistaxis, patient began noticing a large amount of dark tarry stools. Denies episodes of hematemesis. He denies a history of GI bleeds and is not currently anti-coagulated.  The history is provided by the patient and a relative.       Home Medications Prior to Admission medications   Medication Sig Start Date End Date Taking? Authorizing Provider  acetaminophen (TYLENOL) 160 MG/5ML solution Take 31.3 mLs (1,000 mg total) by mouth every 6 (six) hours. Patient not taking: Reported on 04/12/2022 11/06/20   Romie Levee, MD  Fluticasone-Umeclidin-Vilant (TRELEGY ELLIPTA) 200-62.5-25 MCG/INH AEPB Inhale 1 puff into the lungs daily. Patient not taking: Reported on 12/20/2020 11/06/20   Romie Levee, MD      Allergies    Patient has no known allergies.    Review of Systems   Review of Systems  Constitutional:  Negative for fever.  HENT:  Positive for nosebleeds.   Respiratory:  Negative for shortness of breath.   Cardiovascular:  Negative for chest pain.  Gastrointestinal:  Positive for blood in stool and nausea. Negative for abdominal pain, diarrhea and vomiting.  Neurological:  Positive for light-headedness.    Physical Exam Updated Vital Signs BP 125/68   Pulse 71   Temp 97.8 F (36.6 C) (Oral)   Resp 17   Ht 5\' 7"  (1.702 m)    Wt 63.5 kg   SpO2 93%   BMI 21.93 kg/m  Physical Exam Vitals and nursing note reviewed. Exam conducted with a chaperone present.  Constitutional:      General: He is not in acute distress.    Appearance: Normal appearance. He is well-developed. He is not ill-appearing.  HENT:     Head: Normocephalic.     Nose:     Comments: Dried blood present to the bilateral naris.  No active bleeding identified.      Mouth/Throat:     Mouth: Mucous membranes are moist.     Comments: Dried blood present around mouth and in the posterior oropharynx.  No active bleeding appreciated. Cardiovascular:     Rate and Rhythm: Normal rate and regular rhythm.     Pulses:          Radial pulses are 2+ on the right side and 2+ on the left side.     Heart sounds: No murmur heard. Pulmonary:     Effort: Pulmonary effort is normal. No respiratory distress.     Breath sounds: Normal breath sounds.  Abdominal:     Palpations: Abdomen is soft.     Tenderness: There is no abdominal tenderness.  Genitourinary:    Comments: RN present at bedside during rectal exam.  Exam with large amount of gross melena, no bright red blood. Skin:    General: Skin is warm and dry.  Neurological:  Mental Status: He is alert.     ED Results / Procedures / Treatments   Labs (all labs ordered are listed, but only abnormal results are displayed) Labs Reviewed  COMPREHENSIVE METABOLIC PANEL - Abnormal; Notable for the following components:      Result Value   Chloride 112 (*)    CO2 21 (*)    Glucose, Bld 129 (*)    BUN 33 (*)    Calcium 8.8 (*)    Total Protein 6.4 (*)    Albumin 3.4 (*)    All other components within normal limits  POC OCCULT BLOOD, ED - Abnormal; Notable for the following components:   Fecal Occult Bld POSITIVE (*)    All other components within normal limits  CBC  PROTIME-INR  HIV ANTIBODY (ROUTINE TESTING W REFLEX)  CBC WITH DIFFERENTIAL/PLATELET  COMPREHENSIVE METABOLIC PANEL  MAGNESIUM   PHOSPHORUS  TYPE AND SCREEN  ABO/RH    EKG None  Radiology No results found.  Procedures Procedures   Medications Ordered in ED Medications  oxymetazoline (AFRIN) 0.05 % nasal spray 1 spray (0 sprays Each Nare Hold 04/12/22 2141)  lactated ringers infusion ( Intravenous New Bag/Given 04/12/22 2237)  acetaminophen (TYLENOL) tablet 650 mg (has no administration in time range)  oxyCODONE (Oxy IR/ROXICODONE) immediate release tablet 5 mg (has no administration in time range)  ondansetron (ZOFRAN) injection 4 mg (has no administration in time range)  melatonin tablet 5 mg (has no administration in time range)  pantoprazole (PROTONIX) injection 40 mg (has no administration in time range)  lactated ringers bolus 1,000 mL (1,000 mLs Intravenous New Bag/Given 04/12/22 2141)  pantoprazole (PROTONIX) injection 40 mg (40 mg Intravenous Given 04/12/22 2141)    ED Course/ Medical Decision Making/ A&P                           Medical Decision Making Amount and/or Complexity of Data Reviewed Independent Historian: caregiver External Data Reviewed: notes. Labs: ordered.  Risk OTC drugs. Prescription drug management. Decision regarding hospitalization.   71 year old male with a past medical history as above presents today following several episodes of epistaxis and new onset melena.  On arrival, patient was initially hypotensive in the 80s/60s which resolved spontaneously.  On rectal exam, he has obvious frank melena.  Unfortunately degree of this melena appears substantially greater than the reported epistaxis.  At this point, cannot exclude an underlying upper GI bleed, especially in the setting of his hypotension.  I reviewed and interpreted the patient's labs, hemoglobin is within normal limits at 14.3.  CMP with elevated BUN of 33 which additionally supports a possible underlying GI pathology.  Creatinine within normal limits.  Patient was given a dose of IV Protonix.  I spoke with  the on-call gastroenterologist via secure chat.  GI recommended making the patient n.p.o. at midnight for possible scope in the morning.  I spoke with the inpatient medicine team who is agreeable to accept patient to their service.  Results and the plan of care were discussed with the patient and his daughter at bedside with verbalized understanding.  At this time, patient stable for transfer to the floor.  Final Clinical Impression(s) / ED Diagnoses Final diagnoses:  Melena    Rx / DC Orders ED Discharge Orders     None         Artelia Game, [place], MD 04/13/22 Mare Ferrari, MD 04/14/22 [phone]

## 2022-04-13 DIAGNOSIS — K922 Gastrointestinal hemorrhage, unspecified: Secondary | ICD-10-CM | POA: Diagnosis not present

## 2022-04-13 DIAGNOSIS — K921 Melena: Secondary | ICD-10-CM

## 2022-04-13 DIAGNOSIS — R040 Epistaxis: Secondary | ICD-10-CM | POA: Diagnosis not present

## 2022-04-13 LAB — COMPREHENSIVE METABOLIC PANEL
ALT: 30 U/L (ref 0–44)
AST: 15 U/L (ref 15–41)
Albumin: 3 g/dL — ABNORMAL LOW (ref 3.5–5.0)
Alkaline Phosphatase: 57 U/L (ref 38–126)
Anion gap: 8 (ref 5–15)
BUN: 29 mg/dL — ABNORMAL HIGH (ref 8–23)
CO2: 21 mmol/L — ABNORMAL LOW (ref 22–32)
Calcium: 8.5 mg/dL — ABNORMAL LOW (ref 8.9–10.3)
Chloride: 111 mmol/L (ref 98–111)
Creatinine, Ser: 0.78 mg/dL (ref 0.61–1.24)
GFR, Estimated: >60 mL/min (ref >60)
Glucose, Bld: 102 mg/dL — ABNORMAL HIGH (ref 70–99)
Potassium: 4 mmol/L (ref 3.5–5.1)
Sodium: 140 mmol/L (ref 135–145)
Total Bilirubin: 0.5 mg/dL (ref 0.3–1.2)
Total Protein: 5.6 g/dL — ABNORMAL LOW (ref 6.5–8.1)

## 2022-04-13 LAB — CBC WITH DIFFERENTIAL/PLATELET
Abs Immature Granulocytes: 0.11 10*3/uL — ABNORMAL HIGH (ref 0.00–0.07)
Basophils Absolute: 0.1 10*3/uL (ref 0.0–0.1)
Basophils Relative: 0 %
Eosinophils Absolute: 0.1 10*3/uL (ref 0.0–0.5)
Eosinophils Relative: 1 %
HCT: 39.9 % (ref 39.0–52.0)
Hemoglobin: 13.2 g/dL (ref 13.0–17.0)
Immature Granulocytes: 1 %
Lymphocytes Relative: 12 %
Lymphs Abs: 1.4 10*3/uL (ref 0.7–4.0)
MCH: 29.2 pg (ref 26.0–34.0)
MCHC: 33.1 g/dL (ref 30.0–36.0)
MCV: 88.3 fL (ref 80.0–100.0)
Monocytes Absolute: 0.8 10*3/uL (ref 0.1–1.0)
Monocytes Relative: 7 %
Neutro Abs: 9.4 10*3/uL — ABNORMAL HIGH (ref 1.7–7.7)
Neutrophils Relative %: 79 %
Platelets: 259 10*3/uL (ref 150–400)
RBC: 4.52 MIL/uL (ref 4.22–5.81)
RDW: 13.8 % (ref 11.5–15.5)
WBC: 11.8 10*3/uL — ABNORMAL HIGH (ref 4.0–10.5)
nRBC: 0 % (ref 0.0–0.2)

## 2022-04-13 LAB — HIV ANTIBODY (ROUTINE TESTING W REFLEX): HIV Screen 4th Generation wRfx: NONREACTIVE

## 2022-04-13 LAB — MAGNESIUM: Magnesium: 1.9 mg/dL (ref 1.7–2.4)

## 2022-04-13 LAB — PHOSPHORUS: Phosphorus: 3.4 mg/dL (ref 2.5–4.6)

## 2022-04-13 MED ORDER — ADULT MULTIVITAMIN W/MINERALS CH
1.0000 | ORAL_TABLET | Freq: Every day | ORAL | Status: DC
  Administered 2022-04-13: 1 via ORAL
  Filled 2022-04-13: qty 1

## 2022-04-13 MED ORDER — THIAMINE HCL 100 MG/ML IJ SOLN: 100.0000 mg | Freq: Every day | INTRAMUSCULAR | Status: DC

## 2022-04-13 MED ORDER — THIAMINE HCL 100 MG PO TABS
100.0000 mg | ORAL_TABLET | Freq: Every day | ORAL | Status: DC
  Administered 2022-04-13: 100 mg via ORAL
  Filled 2022-04-13: qty 1

## 2022-04-13 MED ORDER — LORAZEPAM 2 MG/ML IJ SOLN: 1.0000 mg | INTRAMUSCULAR | Status: DC | PRN

## 2022-04-13 MED ORDER — FOLIC ACID 1 MG PO TABS
1.0000 mg | ORAL_TABLET | Freq: Every day | ORAL | Status: DC
  Administered 2022-04-13: 1 mg via ORAL
  Filled 2022-04-13: qty 1

## 2022-04-13 MED ORDER — LORAZEPAM 1 MG PO TABS: 1.0000 mg | ORAL_TABLET | ORAL | Status: DC | PRN

## 2022-04-13 NOTE — Care Management Obs Status (Signed)
MEDICARE OBSERVATION STATUS NOTIFICATION   Patient Details  Name: Lucas George MRN: [id number] Date of Birth: [birth year]   Medicare Observation Status Notification Given:  Yes    Oletta Cohn, RN 04/13/2022, 1:35 PM

## 2022-04-13 NOTE — Consult Note (Signed)
Consultation  Referring Provider:  [facility]/ Pokhrel  Primary Care Physician:  Patient, No Pcp Per Primary Gastroenterologist:  none  Reason for Consultation:   melena, hypotension  HPI: Lucas George is a 71 y.o. male, with no prior GI history, who presented to the emergency room last night after he had onset of epistaxis at home.  He says this started spontaneously with red blood coming from his nose.  He says he was unable to get the bleeding to stop, and also could tell that he was swallowing a lot of blood which made him feel like he was choking.  He says he coughed up a couple of times with some clots.  His family member who is in the room currently confirms that there was a lot of blood soaking towels etc.  Within about an hour of onset of the bleeding he had urge for bowel movement and passed black stool.  He did this twice before arrival to the emergency room.  He was apparently hypotensive on arrival but this quickly resolved with a fluid bolus. He says he had 1 further episode of melena in the emergency room which was early in the morning hours and has not had any further since.  He denies any nausea, has not had any hematemesis.  The epistaxis stopped by itself without any intervention from the emergency room.  He does not currently feel that he is having any bleeding posteriorly.  Has no urge for bowel movement. He has had no recent complaints of abdominal pain, changes in bowel habits, and had not noted any melena or hematochezia recently prior to onset of this acute episode.  No complaints of heartburn or indigestion or dysphagia. Patient is not anticoagulated and does not take any regular aspirin or NSAIDs. No Prior GI evaluation. He does drink some alcohol on a daily basis.  Baseline hemoglobin around 14 from January 2022 On arrival yesterday hemoglobin 14.3/hematocrit 44.3/MCV 89/platelets 275 INR 1.2 BUN 33/creatinine 1.13, LFTs within normal limits  Today hemoglobin  13.2/hematocrit 39.9 BUN 29/creatinine 0.79 Chest x-ray negative    History reviewed. No pertinent past medical history.  History reviewed. No pertinent surgical history.  Prior to Admission medications   Medication Sig Start Date End Date Taking? Authorizing Provider  acetaminophen (TYLENOL) 160 MG/5ML solution Take 31.3 mLs (1,000 mg total) by mouth every 6 (six) hours. Patient not taking: Reported on 04/12/2022 11/06/20   Romie Levee, MD  Fluticasone-Umeclidin-Vilant (TRELEGY ELLIPTA) 200-62.5-25 MCG/INH AEPB Inhale 1 puff into the lungs daily. Patient not taking: Reported on 12/20/2020 11/06/20   Romie Levee, MD    Current Facility-Administered Medications  Medication Dose Route Frequency Provider Last Rate Last Admin   acetaminophen (TYLENOL) tablet 650 mg  650 mg Oral Q6H PRN Dow Adolph N, DO       folic acid (FOLVITE) tablet 1 mg  1 mg Oral Daily Hall, Carole N, DO       lactated ringers infusion   Intravenous Continuous Darlin Drop, DO 50 mL/hr at 04/12/22 2237 New Bag at 04/12/22 2237   LORazepam (ATIVAN) tablet 1-4 mg  1-4 mg Oral Q1H PRN Darlin Drop, DO       Or   LORazepam (ATIVAN) injection 1-4 mg  1-4 mg Intravenous Q1H PRN Dow Adolph N, DO       melatonin tablet 5 mg  5 mg Oral QHS PRN Dow Adolph N, DO       multivitamin with minerals tablet 1 tablet  1 tablet Oral Daily Dow Adolph N, DO       ondansetron [facility]) injection 4 mg  4 mg Intravenous Q6H PRN Dow Adolph N, DO       oxyCODONE (Oxy IR/ROXICODONE) immediate release tablet 5 mg  5 mg Oral Q6H PRN Hall, Carole N, DO       oxymetazoline (AFRIN) 0.05 % nasal spray 1 spray  1 spray Each Nare Once Causey, [place], MD       pantoprazole (PROTONIX) injection 40 mg  40 mg Intravenous BID Hall, Carole N, DO       thiamine tablet 100 mg  100 mg Oral Daily Hall, Carole N, DO       Or   thiamine (B-1) injection 100 mg  100 mg Intravenous Daily Darlin Drop, DO       Current Outpatient Medications   Medication Sig Dispense Refill   acetaminophen (TYLENOL) 160 MG/5ML solution Take 31.3 mLs (1,000 mg total) by mouth every 6 (six) hours. (Patient not taking: Reported on 04/12/2022) 120 mL 0   Fluticasone-Umeclidin-Vilant (TRELEGY ELLIPTA) 200-62.5-25 MCG/INH AEPB Inhale 1 puff into the lungs daily. (Patient not taking: Reported on 12/20/2020) 28 each 0    Allergies as of 04/12/2022   (No Known Allergies)    No family history on file.  Social History   Socioeconomic History   Marital status: Married    Spouse name: Not on file   Number of children: Not on file   Years of education: Not on file   Highest education level: Not on file  Occupational History   Not on file  Tobacco Use   Smoking status: Some Days    Packs/day: 1.00    Years: 52.00    Total pack years: 52.00    Types: Cigarettes   Smokeless tobacco: Never  Substance and Sexual Activity   Alcohol use: Not Currently   Drug use: Not on file   Sexual activity: Not on file  Other Topics Concern   Not on file  Social History Narrative   Not on file   Social Determinants of Health   Financial Resource Strain: Not on file  Food Insecurity: Not on file  Transportation Needs: Not on file  Physical Activity: Not on file  Stress: Not on file  Social Connections: Not on file  Intimate Partner Violence: Not on file    Review of Systems: Pertinent positive and negative review of systems were noted in the above HPI section.  All other review of systems was otherwise negative.   Physical Exam: Vital signs in last 24 hours: Temp:  [97.8 F (36.6 C)] 97.8 F (36.6 C) (06/15 2104) Pulse Rate:  [64-112] 64 (06/16 0700) Resp:  [13-22] 21 (06/16 0700) BP: (86-146)/(57-85) 125/77 (06/16 0700) SpO2:  [93 %-98 %] 95 % (06/16 0700) Weight:  [63.5 kg] 63.5 kg (06/15 2026)   General:   Alert,  Well-developed, and older African-American male, pleasant and cooperative in NAD Head:  Normocephalic and atraumatic. Eyes:   Sclera clear, no icterus.   Conjunctiva pink. Ears:  Normal auditory acuity. Nose:  No deformity, discharge,  or lesions. Mouth:  No deformity or lesions.   Neck:  Supple; no masses or thyromegaly. Lungs:  Clear throughout to auscultation.   No wheezes, crackles, or rhonchi.  Heart:  Regular rate and rhythm; no murmurs, clicks, rubs,  or gallops. Abdomen:  Soft,nontender, BS active,nonpalp mass or hsm.   Rectal: Not done, documented melena on rectal exam per ER  MD Msk:  Symmetrical without gross deformities. . Pulses:  Normal pulses noted. Extremities:  Without clubbing or edema. Neurologic:  Alert and  oriented x4;  grossly normal neurologically. Skin:  Intact without significant lesions or rashes.. Psych:  Alert and cooperative. Normal mood and affect.  Intake/Output from previous day: 06/15 0701 - 06/16 0700 In: 1000 [IV Piggyback:1000] Out: 200 [Urine:200] Intake/Output this shift: No intake/output data recorded.  Lab Results: Recent Labs    04/12/22 2045 04/13/22 0331  WBC 7.6 11.8*  HGB 14.3 13.2  HCT 44.3 39.9  PLT 275 259   BMET Recent Labs    04/12/22 2045 04/13/22 0331  NA 140 140  K 4.1 4.0  CL 112* 111  CO2 21* 21*  GLUCOSE 129* 102*  BUN 33* 29*  CREATININE 1.13 0.78  CALCIUM 8.8* 8.5*   LFT Recent Labs    04/13/22 0331  PROT 5.6*  ALBUMIN 3.0*  AST 15  ALT 30  ALKPHOS 57  BILITOT 0.5   PT/INR Recent Labs    04/12/22 2045  LABPROT 15.2  INR 1.2   Hepatitis Panel No results for input(s): "HEPBSAG", "HCVAB", "HEPAIGM", "HEPBIGM" in the last 72 hours.    IMPRESSION:  #38  71 year old African-American male presenting with spontaneous onset of epistaxis at home last night, by history had a lot of bleeding anteriorly from both nostrils and also posteriorly feeling blood in the pharynx which was causing him to have a sensation of choking and actually had brought up a couple of clots with this.  After onset of epistaxis within 30 to 40  minutes he had 2  black bowel movements.  Briefly hypotensive in the ER resolved with fluid bolus and has been stable since Show hemoglobin 14.3, down to 13.2 this morning, normocytic  1 episode of melena after arrival and none since  I do not think that this represents an upper GI bleed, I think he had a significant nosebleed/epistaxis, likely posteriorly causing him to swallow a lot of blood and then passed melena due to swallowed blood  He is currently stable and hemoglobin normal  # 2 COPD #3 history of EtOH use  Plan; discussed with Dr. Leone Payor, do not think endoscopy is indicated at present. Okay to start diet Observe with serial hemoglobins today, and if remains stable he could even possibly be discharged home later today I did discuss outpatient evaluation with colonoscopy for screening as he has never had one .    Woodward Klem EsterwoodPA-C  04/13/2022, 8:49 AM

## 2022-04-13 NOTE — ED Notes (Signed)
After being told he was still on a liquid diet, patient became upset, removed his IV dressed himself, and then walked independently with steady gait out of the department.

## 2022-04-13 NOTE — H&P (Addendum)
History and Physical  Lucas George WSF:[id number] DOB: [birth year] DOA: 04/12/2022  Referring physician: Dr. Axel Filler, Resident-EDP  PCP: Patient, No Pcp Per  Outpatient Specialists: None Patient coming from: Home  Chief Complaint: Dark stools  HPI: Lucas George is a 71 y.o. 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  Also reports 3 episodes of epistaxis.  No abdominal pain.  Denies use of NSAIDs.  He came into the ED for further evaluation.  He was hypotensive with systolic blood pressure in the 60s on initial assessment.  His hypotension resolved with 1 L IV fluid bolus.  Rectal exam done by EDP revealed frank melena, positive FOBT, with concern for upper GI bleed.  The patient was started on IV PPI BID.  [facility], hospitalist service, was asked to admit.  ED Course: Tmax 97.8.  BP 122/73, pulse 81, respiratory 16, saturation 93% on room air.  Lab studies remarkable for serum bicarb 21, anion gap 7.  BUN 33, creatinine 1.13.  GFR greater than 60.  Albumin 3.4.  Hemoglobin 14.3.  WBC 7.6.  Review of Systems: Review of systems as noted in the HPI. All other systems reviewed and are negative.  Past medical history: None.  Past surgical history: None.  Social History: Smokes half pack per day.  Drinks daily, last alcohol intake was 2 days ago.  Denies use of illicit drugs.   No Known Allergies  Family history: Mother and father deceased of old age Sister is healthy.  Prior to Admission medications   Medication Sig Start Date End Date Taking? Authorizing Provider  acetaminophen (TYLENOL) 160 MG/5ML solution Take 31.3 mLs (1,000 mg total) by mouth every 6 (six) hours. Patient not taking: Reported on 04/12/2022 11/06/20   Romie Levee, MD  Fluticasone-Umeclidin-Vilant (TRELEGY ELLIPTA) 200-62.5-25 MCG/INH AEPB Inhale 1 puff into the lungs daily. Patient not  taking: Reported on 12/20/2020 11/06/20   Romie Levee, MD    Physical Exam: BP 125/68   Pulse 71   Temp 97.8 F (36.6 C) (Oral)   Resp 17   Ht 5\' 7"  (1.702 m)   Wt 63.5 kg   SpO2 93%   BMI 21.93 kg/m   General: 71 y.o. year-old male well developed well nourished in no acute distress.  Alert and oriented x3. Cardiovascular: Regular rate and rhythm with no rubs or gallops.  No thyromegaly or JVD noted.  No lower extremity edema. 2/4 pulses in all 4 extremities. Respiratory: Clear to auscultation with no wheezes or rales. Good inspiratory effort. Abdomen: Soft nontender nondistended with normal bowel sounds x4 quadrants. Muskuloskeletal: No cyanosis, clubbing or edema noted bilaterally Neuro: CN II-XII intact, strength, sensation, reflexes Skin: No ulcerative lesions noted or rashes Psychiatry: Judgement and insight appear normal. Mood is appropriate for condition and setting          Labs on Admission:  Basic Metabolic Panel: Recent Labs  Lab 04/12/22 2045  NA 140  K 4.1  CL 112*  CO2 21*  GLUCOSE 129*  BUN 33*  CREATININE 1.13  CALCIUM 8.8*   Liver Function Tests: Recent Labs  Lab 04/12/22 2045  AST 19  ALT 32  ALKPHOS 63  BILITOT 0.6  PROT 6.4*  ALBUMIN 3.4*   No results for input(s): "LIPASE", "AMYLASE" in the last 168 hours. No results for input(s): "AMMONIA" in the last 168 hours. CBC: Recent Labs  Lab 04/12/22 2045  WBC 7.6  HGB 14.3  HCT 44.3  MCV 89.5  PLT 275   Cardiac Enzymes: No results for input(s): "CKTOTAL", "CKMB", "CKMBINDEX", "TROPONINI" in the last 168 hours.  BNP (last 3 results) No results for input(s): "BNP" in the last 8760 hours.  ProBNP (last 3 results) No results for input(s): "PROBNP" in the last 8760 hours.  CBG: No results for input(s): "GLUCAP" in the last 168 hours.  Radiological Exams on Admission: No results found.  EKG: I independently viewed the EKG done and my findings are as followed: Sinus rhythm rate of  76.  Nonspecific ST-T changes.  QTc 410.  Assessment/Plan Present on Admission:  GI bleed  Principal Problem:   GI bleed  GI bleed, suspected upper Presented with large melena Not on anticoagulation or antiplatelets. Denies use of NSAIDs Initial hemoglobin stable at 14.3 Positive FOBT Started on IV PPI Protonix 40 mg twice daily Started on clear liquid diet N.p.o. after midnight except for meds and sips until seen by GI GI consulted by EDP  Mild non anion gap metabolic acidosis Serum bicarb 21, anion gap of 7 Gentle IV fluid hydration LR 50 cc/h x 1 day.  Tobacco use disorder Tobacco cessation counseling  Alcohol use disorder Last alcoholic drink was liquor, 2 days ago CIWA protocol  Reported new onset epistaxis -As needed Afrin    DVT prophylaxis: SCDs due to concern for upper GI bleed.  Code Status: Full code  Family Communication: None at bedside  Disposition Plan: Admitted to telemetry medical unit  Consults called: GI consulted by EDP.  Admission status: Observation status.   Status is: Observation    Darlin Drop MD [facility] Pager [phone]  If 7PM-7AM, please contact night-coverage [email or web address] Password [facility]  04/13/2022, 12:07 AM

## 2022-04-13 NOTE — Discharge Summary (Signed)
Patient was admitted this morning for melena.  Patient stated that that he did have a significant epistaxis which has resolved at this time.  GI has seen the patient and thought secondary to epistaxis.  Patient was advised to check H&H few more times to ensure stability.  I have been told by the RN taking care of the patient.that the patient walked out of the building dressed up and left the premises.  Patient has left the building AGAINST MEDICAL ADVICE.

## 2023-04-23 ENCOUNTER — Ambulatory Visit (HOSPITAL_COMMUNITY)
Admission: RE | Admit: 2023-04-23 | Discharge: 2023-04-23 | Disposition: A | Discharge status: Home / Self Care | Payer: 59 | Source: Ambulatory Visit | Attending: Vascular Surgery | Admitting: Vascular Surgery

## 2023-04-23 ENCOUNTER — Ambulatory Visit (INDEPENDENT_AMBULATORY_CARE_PROVIDER_SITE_OTHER): Payer: 59 | Admitting: Vascular Surgery

## 2023-04-23 ENCOUNTER — Other Ambulatory Visit: Payer: Self-pay | Admitting: *Deleted

## 2023-04-23 ENCOUNTER — Encounter: Payer: Self-pay | Admitting: Vascular Surgery

## 2023-04-23 VITALS — BP 140/88 | HR 79 | Temp 98.4°F | Resp 20 | Ht 67.0 in | Wt 137.0 lb

## 2023-04-23 DIAGNOSIS — M79673 Pain in unspecified foot: Secondary | ICD-10-CM

## 2023-04-23 DIAGNOSIS — I70223 Atherosclerosis of native arteries of extremities with rest pain, bilateral legs: Secondary | ICD-10-CM

## 2023-04-23 LAB — VAS US ABI WITH/WO TBI
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
## 2023-04-24 ENCOUNTER — Other Ambulatory Visit: Payer: Self-pay

## 2023-04-24 DIAGNOSIS — I70223 Atherosclerosis of native arteries of extremities with rest pain, bilateral legs: Secondary | ICD-10-CM

## 2023-04-26 ENCOUNTER — Ambulatory Visit (HOSPITAL_COMMUNITY)
Admission: RE | Admit: 2023-04-26 | Discharge: 2023-04-26 | Disposition: A | Discharge status: Home / Self Care | Payer: 59 | Attending: Vascular Surgery | Admitting: Vascular Surgery

## 2023-04-26 ENCOUNTER — Encounter (HOSPITAL_COMMUNITY)
Admission: RE | Disposition: A | Discharge status: Home / Self Care | Payer: Self-pay | Source: Home / Self Care | Attending: Vascular Surgery

## 2023-04-26 DIAGNOSIS — I70222 Atherosclerosis of native arteries of extremities with rest pain, left leg: Secondary | ICD-10-CM | POA: Diagnosis not present

## 2023-04-26 DIAGNOSIS — F17210 Nicotine dependence, cigarettes, uncomplicated: Secondary | ICD-10-CM | POA: Insufficient documentation

## 2023-04-26 DIAGNOSIS — I70223 Atherosclerosis of native arteries of extremities with rest pain, bilateral legs: Secondary | ICD-10-CM

## 2023-04-26 HISTORY — PX: PERIPHERAL VASCULAR BALLOON ANGIOPLASTY: CATH118281

## 2023-04-26 HISTORY — PX: ABDOMINAL AORTOGRAM W/LOWER EXTREMITY: CATH118223

## 2023-04-26 LAB — POCT I-STAT, CHEM 8
BUN: 9 mg/dL (ref 8–23)
Calcium, Ion: 1.21 mmol/L (ref 1.15–1.40)
Chloride: 109 mmol/L (ref 98–111)
Creatinine, Ser: 0.7 mg/dL (ref 0.61–1.24)
Glucose, Bld: 84 mg/dL (ref 70–99)
HCT: 49 % (ref 39.0–52.0)
Hemoglobin: 16.7 g/dL (ref 13.0–17.0)
Potassium: 3.9 mmol/L (ref 3.5–5.1)
Sodium: 143 mmol/L (ref 135–145)
TCO2: 25 mmol/L (ref 22–32)

## 2023-04-26 LAB — POCT ACTIVATED CLOTTING TIME
Activated Clotting Time: 207 seconds
Activated Clotting Time: 177 seconds

## 2023-04-26 SURGERY — ABDOMINAL AORTOGRAM W/LOWER EXTREMITY
Anesthesia: LOCAL | Laterality: Left

## 2023-04-26 MED ORDER — SODIUM CHLORIDE 0.9 % IV SOLN: INTRAVENOUS | Status: DC

## 2023-04-26 MED ORDER — HEPARIN SODIUM (PORCINE) 1000 UNIT/ML IJ SOLN
INTRAMUSCULAR | Status: AC
  Filled 2023-04-26: qty 10

## 2023-04-26 MED ORDER — CLOPIDOGREL BISULFATE 75 MG PO TABS
75.0000 mg | ORAL_TABLET | Freq: Every day | ORAL | Status: DC
  Administered 2023-04-26: 75 mg via ORAL

## 2023-04-26 MED ORDER — MIDAZOLAM HCL 2 MG/2ML IJ SOLN
INTRAMUSCULAR | Status: AC
  Filled 2023-04-26: qty 2

## 2023-04-26 MED ORDER — MIDAZOLAM HCL 2 MG/2ML IJ SOLN
INTRAMUSCULAR | Status: DC | PRN
  Administered 2023-04-26: 1 mg via INTRAVENOUS

## 2023-04-26 MED ORDER — CLOPIDOGREL BISULFATE 75 MG PO TABS: 300.0000 mg | ORAL_TABLET | Freq: Once | ORAL | Status: DC

## 2023-04-26 MED ORDER — ACETAMINOPHEN 325 MG PO TABS: 650.0000 mg | ORAL_TABLET | ORAL | Status: DC | PRN

## 2023-04-26 MED ORDER — SODIUM CHLORIDE 0.9 % WEIGHT BASED INFUSION: 1.0000 mL/kg/h | INTRAVENOUS | Status: DC

## 2023-04-26 MED ORDER — ONDANSETRON HCL 4 MG/2ML IJ SOLN: 4.0000 mg | Freq: Four times a day (QID) | INTRAMUSCULAR | Status: DC | PRN

## 2023-04-26 MED ORDER — IODIXANOL 320 MG/ML IV SOLN
INTRAVENOUS | Status: DC | PRN
  Administered 2023-04-26: 90 mL

## 2023-04-26 MED ORDER — HEPARIN (PORCINE) IN NACL 1000-0.9 UT/500ML-% IV SOLN
INTRAVENOUS | Status: DC | PRN
  Administered 2023-04-26 (×2): 500 mL

## 2023-04-26 MED ORDER — ASPIRIN 81 MG PO CHEW
CHEWABLE_TABLET | ORAL | Status: DC | PRN
  Administered 2023-04-26: 81 mg

## 2023-04-26 MED ORDER — ASPIRIN 81 MG PO CHEW
CHEWABLE_TABLET | ORAL | Status: AC
  Filled 2023-04-26: qty 1

## 2023-04-26 MED ORDER — LABETALOL HCL 5 MG/ML IV SOLN: 10.0000 mg | INTRAVENOUS | Status: DC | PRN

## 2023-04-26 MED ORDER — LIDOCAINE HCL (PF) 1 % IJ SOLN
INTRAMUSCULAR | Status: AC
  Filled 2023-04-26: qty 30

## 2023-04-26 MED ORDER — SODIUM CHLORIDE 0.9 % IV SOLN: 250.0000 mL | INTRAVENOUS | Status: DC | PRN

## 2023-04-26 MED ORDER — SODIUM CHLORIDE 0.9% FLUSH: 3.0000 mL | INTRAVENOUS | Status: DC | PRN

## 2023-04-26 MED ORDER — HYDRALAZINE HCL 20 MG/ML IJ SOLN
5.0000 mg | INTRAMUSCULAR | Status: AC | PRN
  Administered 2023-04-26 (×2): 5 mg via INTRAVENOUS

## 2023-04-26 MED ORDER — CLOPIDOGREL BISULFATE 75 MG PO TABS
ORAL_TABLET | ORAL | Status: AC
  Filled 2023-04-26: qty 1

## 2023-04-26 MED ORDER — SODIUM CHLORIDE 0.9% FLUSH: 3.0000 mL | Freq: Two times a day (BID) | INTRAVENOUS | Status: DC

## 2023-04-26 MED ORDER — FENTANYL CITRATE (PF) 100 MCG/2ML IJ SOLN
INTRAMUSCULAR | Status: DC | PRN
  Administered 2023-04-26: 50 ug via INTRAVENOUS

## 2023-04-26 MED ORDER — CLOPIDOGREL BISULFATE 300 MG PO TABS: ORAL_TABLET | ORAL | Status: DC | PRN

## 2023-04-26 MED ORDER — LIDOCAINE HCL (PF) 1 % IJ SOLN
INTRAMUSCULAR | Status: DC | PRN
  Administered 2023-04-26: 15 mL

## 2023-04-26 MED ORDER — ASPIRIN 81 MG PO TBEC: 81.0000 mg | DELAYED_RELEASE_TABLET | Freq: Every day | ORAL | Status: DC

## 2023-04-26 MED ORDER — FENTANYL CITRATE (PF) 100 MCG/2ML IJ SOLN
INTRAMUSCULAR | Status: AC
  Filled 2023-04-26: qty 2

## 2023-04-26 MED ORDER — HYDRALAZINE HCL 20 MG/ML IJ SOLN
INTRAMUSCULAR | Status: AC
  Filled 2023-04-26: qty 1

## 2023-04-26 MED ORDER — HEPARIN SODIUM (PORCINE) 1000 UNIT/ML IJ SOLN
INTRAMUSCULAR | Status: DC | PRN
  Administered 2023-04-26: 7000 [IU] via INTRAVENOUS

## 2023-04-26 MED ORDER — CLOPIDOGREL BISULFATE 75 MG PO TABS: 75.0000 mg | ORAL_TABLET | Freq: Every day | ORAL | 11 refills | Status: AC

## 2023-04-26 SURGICAL SUPPLY — 20 items
BALLN STERLING OTW 3X220X150 (BALLOONS) ×1
BALLOON STERLING OTW 3X220X150 (BALLOONS) IMPLANT
CATH OMNI FLUSH 5F 65CM (CATHETERS) IMPLANT
DCB RANGER 4.0X40 135 (BALLOONS) IMPLANT
GUIDEWIRE ANGLED .035X150CM (WIRE) IMPLANT
KIT ENCORE 26 ADVANTAGE (KITS) IMPLANT
KIT MICROPUNCTURE NIT STIFF (SHEATH) IMPLANT
KIT PV (KITS) ×2 IMPLANT
RANGER DCB 4.0X40 135 (BALLOONS) ×1
SHEATH CATAPULT 5FR 90 (SHEATH) IMPLANT
SHEATH PINNACLE 5F 10CM (SHEATH) IMPLANT
SHEATH PROBE COVER 6X72 (BAG) IMPLANT
STOPCOCK MORSE 400PSI 3WAY (MISCELLANEOUS) IMPLANT
SYR MEDRAD MARK 7 150ML (SYRINGE) ×2 IMPLANT
TRANSDUCER W/STOPCOCK (MISCELLANEOUS) ×2 IMPLANT
TRAY PV CATH (CUSTOM PROCEDURE TRAY) ×2 IMPLANT
TUBING CIL FLEX 10 FLL-RA (TUBING) IMPLANT
WIRE G V18X300CM (WIRE) IMPLANT
WIRE HI TORQ VERSACORE 300 (WIRE) IMPLANT
WIRE STARTER BENTSON 035X150 (WIRE) IMPLANT

## 2023-04-26 NOTE — Progress Notes (Signed)
Sheath Removal-  27f sheath removed from right groin. 30 min manual pressure, removed intact no complications. VVS stable throughout.  Bedrest at 1650.  Clean dry intact.   Malva Limes RN

## 2023-04-26 NOTE — Interval H&P Note (Signed)
History and Physical Interval Note:  04/26/2023 2:06 PM  Lucas George  has presented today for surgery, with the diagnosis of pad w/ rest pain.  The various methods of treatment have been discussed with the patient and family. After consideration of risks, benefits and other options for treatment, the patient has consented to  Procedure(s): ABDOMINAL AORTOGRAM W/LOWER EXTREMITY (N/A) as a surgical intervention.  The patient's history has been reviewed, patient examined, no change in status, stable for surgery.  I have reviewed the patient's chart and labs.  Questions were answered to the patient's satisfaction.     [name]

## 2023-04-26 NOTE — Op Note (Signed)
DATE OF SERVICE: 04/26/2023  PATIENT:  Lucas George  72 y.o. male  PRE-OPERATIVE DIAGNOSIS:  Atherosclerosis of native arteries of left lower extremity causing rest pain  POST-OPERATIVE DIAGNOSIS:  Same  PROCEDURE:   1) Ultrasound guided right common femoral artery access 2) Aortogram 3) Left lower extremity angiogram with third order cannulation (90mL total contrast) 4) Additional left lower extremity angiogram with third order cannulation 5) Left popliteal drug-coated angioplasty (4x48mm Ranger) 6) Left tibioperoneal trunk angioplasty (3x22mm Sterling) 7) Left peroneal angioplasty (3x227mm Sterling)  8) Conscious sedation (34 minutes)  SURGEON:  Rande Brunt. Lenell Antu, MD  ASSISTANT: none  ANESTHESIA:   local and IV sedation  ESTIMATED BLOOD LOSS: minimal  LOCAL MEDICATIONS USED:  LIDOCAINE   COUNTS: confirmed correct.  PATIENT DISPOSITION:  PACU - hemodynamically stable.   Delay start of Pharmacological VTE agent (>24hrs) due to surgical blood loss or risk of bleeding: no  INDICATION FOR PROCEDURE: Lucas George is a 72 y.o. male with left foot pain and non-invasive evidence of severe peripheral arterial disease. After careful discussion of risks, benefits, and alternatives the patient was offered angiography. The patient understood and wished to proceed.  OPERATIVE FINDINGS:  Bilateral renal arteries patent Terminal aorta patent Iliac arteries and branches patent bilaterally  Left lower extremity: Common femoral artery: patent  Profunda femoris artery: patent  Superficial femoral artery: patent Popliteal artery: patent above and behind the knee; significant stenosis (>70%) in proximal below knee popliteal artery Anterior tibial artery: occluded Tibioperoneal trunk: severe stenosis (>80%) Peroneal artery: several focal areas of severe stenosis (greatest >90%).  Posterior tibial artery: occluded Pedal circulation: fills via peroneal collateral flow  GLASS score.   FP 4. IP 1. Stage IV.  DESCRIPTION OF PROCEDURE: After identification of the patient in the pre-operative holding area, the patient was transferred to the operating room. The patient was positioned supine on the operating room table. Anesthesia was induced. The groins was prepped and draped in standard fashion. A surgical pause was performed confirming correct patient, procedure, and operative location.  The right groin was anesthetized with subcutaneous injection of 1% lidocaine. Using ultrasound guidance, the right common femoral artery was accessed with micropuncture technique. Fluoroscopy was used to confirm cannulation over the femoral head. The 47F sheath was upsized to 38F.   A Benson wire was advanced into the distal aorta. Over the wire an omni flush catheter was advanced to the level of L2. Aortogram was performed - see above for details.   The left common iliac artery was selected with an omniflush catheter and glidewire guidewire. The wire was advanced into the common femoral artery. Over the wire the omni flush catheter was advanced into the external iliac artery. Selective angiography was performed - see above for details.   The decision was made to intervene. The patient was heparinized with 7,000 units of heparin. The 38F sheath was exchanged for a 38F x 90cm sheath. Selective angiography of the left lower extremity was performed prior to intervention.   The lesions were treated with:  Left popliteal drug-coated angioplasty (4x96mm Ranger) Left tibioperoneal trunk angioplasty (3x274mm Sterling) Left peroneal angioplasty (3x293mm Sterling)   Completion angiography revealed:  Resolution of popliteal stenosis Resolution of TP trunk stenosis Resolution of peroneal stenosis  The sheath was left in place to be removed in recovery.   Conscious sedation was administered with the use of IV fentanyl and midazolam under continuous physician and nurse monitoring.  Heart rate, blood pressure,  and oxygen saturation were  continuously monitored.  Total sedation time was 34 minutes  Upon completion of the case instrument and sharps counts were confirmed correct. The patient was transferred to the  PACU in good condition. I was present for all portions of the procedure.  PLAN: ASA 81mg  PO every day. Plavix 75mg  PO every day. High intensity statin therapy. Follow up with me in 2 weeks with ABI and LLE duplex. May need intervention on contralateral leg.  Rande Brunt. Lenell Antu, MD [facility] Vascular and Vein Specialists of [facility] Phone Number: [phone] 04/26/2023 2:06 PM

## 2023-04-26 NOTE — Progress Notes (Signed)
Pt ambulated to and from bathroom to void with no signs of oozing from right groin site  
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
## 2023-04-29 ENCOUNTER — Encounter (HOSPITAL_COMMUNITY): Payer: Self-pay | Admitting: Vascular Surgery

## 2023-04-30 ENCOUNTER — Other Ambulatory Visit: Payer: Self-pay | Admitting: *Deleted

## 2023-04-30 LAB — POCT ACTIVATED CLOTTING TIME: Activated Clotting Time: 232 seconds

## 2023-05-01 ENCOUNTER — Other Ambulatory Visit: Payer: Self-pay | Admitting: *Deleted

## 2023-05-01 DIAGNOSIS — I70223 Atherosclerosis of native arteries of extremities with rest pain, bilateral legs: Secondary | ICD-10-CM

## 2023-05-10 ENCOUNTER — Ambulatory Visit (INDEPENDENT_AMBULATORY_CARE_PROVIDER_SITE_OTHER)
Admission: RE | Admit: 2023-05-10 | Discharge: 2023-05-10 | Disposition: A | Discharge status: Home / Self Care | Payer: 59 | Source: Ambulatory Visit | Attending: Vascular Surgery | Admitting: Vascular Surgery

## 2023-05-10 ENCOUNTER — Ambulatory Visit (HOSPITAL_COMMUNITY)
Admission: RE | Admit: 2023-05-10 | Discharge: 2023-05-10 | Disposition: A | Discharge status: Home / Self Care | Payer: 59 | Source: Ambulatory Visit | Attending: Vascular Surgery | Admitting: Vascular Surgery

## 2023-05-10 DIAGNOSIS — I70223 Atherosclerosis of native arteries of extremities with rest pain, bilateral legs: Secondary | ICD-10-CM | POA: Insufficient documentation

## 2023-05-10 LAB — VAS US ABI WITH/WO TBI
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
## 2023-05-14 ENCOUNTER — Ambulatory Visit (INDEPENDENT_AMBULATORY_CARE_PROVIDER_SITE_OTHER): Payer: 59 | Admitting: Vascular Surgery

## 2023-05-14 ENCOUNTER — Encounter: Payer: Self-pay | Admitting: Vascular Surgery

## 2023-05-14 VITALS — BP 165/103 | HR 98 | Temp 97.8°F | Resp 20 | Ht 67.0 in | Wt 133.0 lb

## 2023-05-14 DIAGNOSIS — I70223 Atherosclerosis of native arteries of extremities with rest pain, bilateral legs: Secondary | ICD-10-CM

## 2023-05-22 ENCOUNTER — Other Ambulatory Visit: Payer: Self-pay

## 2023-05-22 DIAGNOSIS — I70223 Atherosclerosis of native arteries of extremities with rest pain, bilateral legs: Secondary | ICD-10-CM

## 2023-11-19 ENCOUNTER — Ambulatory Visit: Payer: Medicare Other | Admitting: Vascular Surgery

## 2023-11-19 ENCOUNTER — Encounter (HOSPITAL_COMMUNITY): Payer: Medicare Other
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
## 2023-12-17 ENCOUNTER — Ambulatory Visit: Payer: Medicare Other | Admitting: Vascular Surgery

## 2024-01-01 ENCOUNTER — Ambulatory Visit (HOSPITAL_COMMUNITY): Payer: Medicare Other

## 2024-01-13 ENCOUNTER — Ambulatory Visit (HOSPITAL_COMMUNITY)
Admission: RE | Admit: 2024-01-13 | Discharge: 2024-01-13 | Disposition: A | Discharge status: Home / Self Care | Source: Ambulatory Visit | Attending: Vascular Surgery | Admitting: Vascular Surgery

## 2024-01-13 DIAGNOSIS — I70223 Atherosclerosis of native arteries of extremities with rest pain, bilateral legs: Secondary | ICD-10-CM | POA: Insufficient documentation

## 2024-01-13 LAB — VAS US ABI WITH/WO TBI
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
## 2024-01-21 ENCOUNTER — Ambulatory Visit: Payer: Medicare Other | Admitting: Vascular Surgery

## 2024-03-03 ENCOUNTER — Ambulatory Visit: Admitting: Vascular Surgery

## 2024-11-11 ENCOUNTER — Telehealth: Payer: Self-pay

## 2024-11-11 NOTE — Telephone Encounter (Signed)
 Patient's daughter, Daphne, called reporting pt c/o bilateral LE and foot rest pain and claudication.    ABI scheduled.  Will make provider appt as appropriate.

## 2024-11-12 ENCOUNTER — Ambulatory Visit (HOSPITAL_COMMUNITY)
Admission: RE | Admit: 2024-11-12 | Discharge: 2024-11-12 | Disposition: A | Discharge status: Home / Self Care | Source: Ambulatory Visit | Attending: Surgery | Admitting: Surgery

## 2024-11-12 ENCOUNTER — Other Ambulatory Visit: Payer: Self-pay

## 2024-11-12 DIAGNOSIS — I70223 Atherosclerosis of native arteries of extremities with rest pain, bilateral legs: Secondary | ICD-10-CM

## 2024-11-12 LAB — VAS US ABI WITH/WO TBI
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
## 2024-11-26 ENCOUNTER — Ambulatory Visit: Attending: Vascular Surgery | Admitting: Physician Assistant

## 2024-11-26 ENCOUNTER — Other Ambulatory Visit: Payer: Self-pay

## 2024-11-26 VITALS — BP 190/101 | HR 81 | Temp 97.9°F | Ht 67.0 in | Wt 140.0 lb

## 2024-11-26 DIAGNOSIS — I70222 Atherosclerosis of native arteries of extremities with rest pain, left leg: Secondary | ICD-10-CM | POA: Diagnosis not present

## 2024-11-26 MED ORDER — ASPIRIN 81 MG PO TBEC: 81.0000 mg | DELAYED_RELEASE_TABLET | Freq: Every day | ORAL | 11 refills | Status: AC

## 2024-11-26 MED ORDER — ATORVASTATIN CALCIUM 20 MG PO TABS: 20.0000 mg | ORAL_TABLET | Freq: Every day | ORAL | 11 refills | Status: AC

## 2024-12-18 ENCOUNTER — Ambulatory Visit (HOSPITAL_COMMUNITY): Admit: 2024-12-18 | Admitting: Vascular Surgery

## 2024-12-18 ENCOUNTER — Encounter (HOSPITAL_COMMUNITY): Payer: Self-pay
